# Patient Record
Sex: Male | Born: 1955
Health system: Southern US, Community
[De-identification: ages and names within clinical notes are randomized; demographics above are authoritative.]

## PROBLEM LIST (undated history)

## (undated) DIAGNOSIS — K219 Gastro-esophageal reflux disease without esophagitis: Secondary | ICD-10-CM

## (undated) DIAGNOSIS — N189 Chronic kidney disease, unspecified: Secondary | ICD-10-CM

## (undated) DIAGNOSIS — M1990 Unspecified osteoarthritis, unspecified site: Secondary | ICD-10-CM

## (undated) DIAGNOSIS — I10 Essential (primary) hypertension: Secondary | ICD-10-CM

## (undated) DIAGNOSIS — F32A Depression, unspecified: Secondary | ICD-10-CM

## (undated) HISTORY — PX: OTHER SURGICAL HISTORY: SHX169

---

## 2020-04-08 ENCOUNTER — Ambulatory Visit (INDEPENDENT_AMBULATORY_CARE_PROVIDER_SITE_OTHER): Payer: No Typology Code available for payment source | Admitting: Orthopaedic Surgery

## 2020-04-08 ENCOUNTER — Ambulatory Visit (INDEPENDENT_AMBULATORY_CARE_PROVIDER_SITE_OTHER): Payer: No Typology Code available for payment source

## 2020-04-08 VITALS — Ht 71.0 in | Wt 220.0 lb

## 2020-04-08 DIAGNOSIS — M1611 Unilateral primary osteoarthritis, right hip: Secondary | ICD-10-CM

## 2020-04-08 DIAGNOSIS — M25551 Pain in right hip: Secondary | ICD-10-CM | POA: Diagnosis not present

## 2020-04-08 DIAGNOSIS — M25559 Pain in unspecified hip: Secondary | ICD-10-CM

## 2020-04-08 NOTE — Progress Notes (Signed)
Office Visit Note   Patient: Peter Wheeler           Date of Birth: [birth year]           MRN: [id number] Visit Date: 04/08/2020              Requested by: No referring provider defined for this encounter. PCP: Patient, No Pcp Per   Assessment & Plan: Visit Diagnoses:  1. Hip pain   2. Unilateral primary osteoarthritis, right hip     Plan: The patient does have significant posttraumatic arthritis of his right hip and given his clinical exam findings, x-ray findings and signs and symptoms as well as the failure of his conservative treatment and worsening hip pain, we are recommending hip replacement surgery.  Also due to the fact that his hip pain is now detriment affecting his actives daily living, his mobility and even his posture contributing to his back pain.  I had a long thorough discussion the office today about the risk and benefits of surgery.  We talked about his interoperative and postoperative course.  I shared with him his x-rays.  I showed him a hip model explained in detail what the surgery involves.  The risk and benefits of surgery were explained in detail as well as what to expect in the recovery process.  All questions and concerns were answered and addressed.  We will work on getting this scheduled.  He understands there may still be some delays due to the Covid 19 pandemic and bed availability for inpatient type procedures.  Follow-Up Instructions: Return for 2 weeks post-op.   Orders:  Orders Placed This Encounter  Procedures  . XR HIP UNILAT W OR W/O PELVIS 1V RIGHT   No orders of the defined types were placed in this encounter.     Procedures: No procedures performed   Clinical Data: No additional findings.   Subjective: Chief Complaint  Patient presents with  . Right Hip - Pain  The patient is a very pleasant 64 year old veteran who comes in with a significant history of right hip pain.  He reports that in 2016 he sustained a mechanical fall  injuring that right hip and he sustained a acetabular fracture that did not require surgery but did require time in rehabilitation to get him back on his feet.  He was going through tough period of his life as well with significant depression and the [place] system is work with him greatly on getting through that and working on his mental health.  He is not a diabetic.  He does report 10 out of 10 right hip pain and hurts in the groin and the backside of his right hip.  He states that he was told at some time he would likely need a hip replacement due to posttraumatic arthritis in that hip.  At this point his right hip pain is definitely affecting his mobility, his quality of life and his activities day living.  Previous x-rays from 2017 showed that he was developing posttraumatic arthritic changes.  He is not a diabetic.  He denies any acute change in medical status.  He does state that he does have low back pain with some degenerative changes but some of this he feels like it is related to his hip and I agree based on seeing him walk in his posture.  HPI  Review of Systems He currently denies any headache, chest pain, shortness of breath, fever, chills, nausea, vomiting  Objective: Vital Signs:  Ht 5\' 11"  (1.803 m)   Wt 220 lb (99.8 kg)   BMI 30.68 kg/m   Physical Exam He is alert and oriented x3 and in no acute distress Ortho Exam Examination of his right hip shows stiffness in the groin and pain with internal and external rotation.  The left hip moves normally.  There is a slight leg length discrepancy with his left side shorter than his right. Specialty Comments:  No specialty comments available.  Imaging: XR HIP UNILAT W OR W/O PELVIS 1V RIGHT  Result Date: 04/08/2020 An AP pelvis and lateral of the right hip show significant arthritic changes.  There is actually slight medialization of the femoral head within the acetabulum compared to the opposite side.  There is significant joint space  narrowing and para-articular osteophytes.  There is evidence of an old healed acetabular fracture.    PMFS History: Patient Active Problem List   Diagnosis Date Noted  . Unilateral primary osteoarthritis, right hip 04/08/2020   No past medical history on file.  No family history on file.   Social History   Occupational History  . Not on file  Tobacco Use  . Smoking status: Not on file  Substance and Sexual Activity  . Alcohol use: Not on file  . Drug use: Not on file  . Sexual activity: Not on file

## 2020-06-29 ENCOUNTER — Other Ambulatory Visit: Payer: Self-pay

## 2020-07-06 NOTE — Progress Notes (Signed)
Need orders in epic.  

## 2020-07-07 ENCOUNTER — Other Ambulatory Visit: Payer: Self-pay | Admitting: Physician Assistant

## 2020-07-08 ENCOUNTER — Encounter (HOSPITAL_COMMUNITY): Payer: Self-pay

## 2020-07-08 ENCOUNTER — Other Ambulatory Visit: Payer: Self-pay

## 2020-07-08 ENCOUNTER — Encounter (HOSPITAL_COMMUNITY)
Admission: RE | Admit: 2020-07-08 | Discharge: 2020-07-08 | Disposition: A | Discharge status: Home / Self Care | Payer: No Typology Code available for payment source | Source: Ambulatory Visit | Attending: Orthopaedic Surgery | Admitting: Orthopaedic Surgery

## 2020-07-08 DIAGNOSIS — Z01818 Encounter for other preprocedural examination: Secondary | ICD-10-CM | POA: Insufficient documentation

## 2020-07-08 HISTORY — DX: Gastro-esophageal reflux disease without esophagitis: K21.9

## 2020-07-08 HISTORY — DX: Depression, unspecified: F32.A

## 2020-07-08 HISTORY — DX: Chronic kidney disease, unspecified: N18.9

## 2020-07-08 HISTORY — DX: Unspecified osteoarthritis, unspecified site: M19.90

## 2020-07-08 HISTORY — DX: Essential (primary) hypertension: I10

## 2020-07-08 LAB — CBC
HCT: 49.6 % (ref 39.0–52.0)
Hemoglobin: 16.4 g/dL (ref 13.0–17.0)
MCH: 32.1 pg (ref 26.0–34.0)
MCHC: 33.1 g/dL (ref 30.0–36.0)
MCV: 97.1 fL (ref 80.0–100.0)
Platelets: 216 10*3/uL (ref 150–400)
RBC: 5.11 MIL/uL (ref 4.22–5.81)
RDW: 13.2 % (ref 11.5–15.5)
WBC: 8.2 10*3/uL (ref 4.0–10.5)
nRBC: 0 % (ref 0.0–0.2)

## 2020-07-08 LAB — BASIC METABOLIC PANEL
Anion gap: 11 (ref 5–15)
BUN: 30 mg/dL — ABNORMAL HIGH (ref 8–23)
CO2: 23 mmol/L (ref 22–32)
Calcium: 9.8 mg/dL (ref 8.9–10.3)
Chloride: 101 mmol/L (ref 98–111)
Creatinine, Ser: 1.74 mg/dL — ABNORMAL HIGH (ref 0.61–1.24)
GFR, Estimated: 44 mL/min — ABNORMAL LOW (ref >60)
Glucose, Bld: 93 mg/dL (ref 70–99)
Potassium: 5 mmol/L (ref 3.5–5.1)
Sodium: 135 mmol/L (ref 135–145)

## 2020-07-08 LAB — SURGICAL PCR SCREEN
MRSA, PCR: NEGATIVE
Staphylococcus aureus: NEGATIVE

## 2020-07-08 NOTE — Progress Notes (Signed)
DUE TO COVID-19 ONLY ONE VISITOR IS ALLOWED TO COME WITH YOU AND STAY IN THE WAITING ROOM ONLY DURING PRE OP AND PROCEDURE DAY OF SURGERY. THE 1 VISITOR  MAY VISIT WITH YOU AFTER SURGERY IN YOUR PRIVATE ROOM DURING VISITING HOURS ONLY!  YOU NEED TO HAVE A COVID 19 TEST ON__11/30/2021 _____ @_______ , THIS TEST MUST BE DONE BEFORE SURGERY,  COVID TESTING SITE [address] [place] , IT IS ON THE RIGHT GOING OUT [address] APPROXIMATELY  2 MINUTES PAST [employer] ON THE RIGHT. ONCE YOUR COVID TEST IS COMPLETED,  PLEASE BEGIN THE QUARANTINE INSTRUCTIONS AS OUTLINED IN YOUR HANDOUT.                Peter Wheeler  07/08/2020   Your procedure is scheduled on:  07/17/2020   Report to [facility] Main  Entrance   Report to admitting at     0830AM     Call this number if you have problems the morning of surgery [phone]    REMEMBER: NO  SOLID FOOD CANDY OR GUM AFTER MIDNIGHT. CLEAR LIQUIDS UNTIL 0800am        . NOTHING BY MOUTH EXCEPT CLEAR LIQUIDS UNTIL    . PLEASE FINISH ENSURE DRINK PER SURGEON ORDER  WHICH NEEDS TO BE COMPLETED AT   0800am   .      CLEAR LIQUID DIET   Foods Allowed                                                                    Coffee and tea, regular and decaf                            Fruit ices (not with fruit pulp)                                      Iced Popsicles                                    Carbonated beverages, regular and diet                                    Cranberry, grape and apple juices Sports drinks like Gatorade Lightly seasoned clear broth or consume(fat free) Sugar, honey syrup ___________________________________________________________________      BRUSH YOUR TEETH MORNING OF SURGERY AND RINSE YOUR MOUTH OUT, NO CHEWING GUM CANDY OR MINTS.     Take these medicines the morning of surgery with A SIP OF WATER:  Anmlodipine, atenolol, pepcid, gabapentin, effexor  DO NOT TAKE ANY  DIABETIC MEDICATIONS DAY OF YOUR SURGERY                               You may not have any metal on your body including hair pins and              piercings  Do not wear jewelry, make-up, lotions, powders or perfumes, deodorant             Do not wear nail polish on your fingernails.  Do not shave  48 hours prior to surgery.              Men may shave face and neck.   Do not bring valuables to the hospital. [facility] IS NOT             RESPONSIBLE   FOR VALUABLES.  Contacts, dentures or bridgework may not be worn into surgery.  Leave suitcase in the car. After surgery it may be brought to your room.     Patients discharged the day of surgery will not be allowed to drive home. IF YOU ARE HAVING SURGERY AND GOING HOME THE SAME DAY, YOU MUST HAVE AN ADULT TO DRIVE YOU HOME AND BE WITH YOU FOR 24 HOURS. YOU MAY GO HOME BY TAXI OR UBER OR ORTHERWISE, BUT AN ADULT MUST ACCOMPANY YOU HOME AND STAY WITH YOU FOR 24 HOURS.  Name and phone number of your driver:  Special Instructions: N/A              Please read over the following fact sheets you were given: _____________________________________________________________________  [facility] - Preparing for Surgery Before surgery, you can play an important role.  Because skin is not sterile, your skin needs to be as free of germs as possible.  You can reduce the number of germs on your skin by washing with CHG (chlorahexidine gluconate) soap before surgery.  CHG is an antiseptic cleaner which kills germs and bonds with the skin to continue killing germs even after washing. Please DO NOT use if you have an allergy to CHG or antibacterial soaps.  If your skin becomes reddened/irritated stop using the CHG and inform your nurse when you arrive at Short Stay. Do not shave (including legs and underarms) for at least 48 hours prior to the first CHG shower.  You may shave your face/neck. Please follow these instructions carefully:  1.  Shower with CHG Soap  the night before surgery and the  morning of Surgery.  2.  If you choose to wash your hair, wash your hair first as usual with your  normal  shampoo.  3.  After you shampoo, rinse your hair and body thoroughly to remove the  shampoo.                           4.  Use CHG as you would any other liquid soap.  You can apply chg directly  to the skin and wash                       Gently with a scrungie or clean washcloth.  5.  Apply the CHG Soap to your body ONLY FROM THE NECK DOWN.   Do not use on face/ open                           Wound or open sores. Avoid contact with eyes, ears mouth and genitals (private parts).                       Wash face,  Genitals (private parts) with your normal soap.             6.  Wash  thoroughly, paying special attention to the area where your surgery  will be performed.  7.  Thoroughly rinse your body with warm water from the neck down.  8.  DO NOT shower/wash with your normal soap after using and rinsing off  the CHG Soap.                9.  Pat yourself dry with a clean towel.            10.  Wear clean pajamas.            11.  Place clean sheets on your bed the night of your first shower and do not  sleep with pets. Day of Surgery : Do not apply any lotions/deodorants the morning of surgery.  Please wear clean clothes to the hospital/surgery center.  FAILURE TO FOLLOW THESE INSTRUCTIONS MAY RESULT IN THE CANCELLATION OF YOUR SURGERY PATIENT SIGNATURE_________________________________  NURSE SIGNATURE__________________________________  ________________________________________________________________________

## 2020-07-08 NOTE — Progress Notes (Signed)
Anesthesia Review:  PCP: DR Leta Jungling- [facility] in [place]  Cardiologist : Chest x-ray : EKG : 07/08/20  Echo : Stress test: Cardiac Cath :  Activity level:  Sleep Study/ CPAP :no  Fasting Blood Sugar :      / Checks Blood Sugar -- times a day:   Blood Thinner/ Instructions /Last Dose: ASA / Instructions/ Last Dose :  Blood pressure initally at preop was 167/106.  Patient denies any chest pain, dizziness, headache or blurred vision .  Blood pressure in right arm .  Recheck of blood pressure was 159/105 in right arm after 15 minutes.  Blood pressure in left arm was 157/106.  Peter Wheeler made aware Peter Wheeler, PAC saw pt at preop and spoke with him . EKG at preop shows NSR.  Peter Wheeler aware..  Of above.  No new orders given.

## 2020-07-14 ENCOUNTER — Other Ambulatory Visit (HOSPITAL_COMMUNITY)
Admission: RE | Admit: 2020-07-14 | Discharge: 2020-07-14 | Disposition: A | Discharge status: Home / Self Care | Payer: No Typology Code available for payment source | Source: Ambulatory Visit | Attending: Orthopaedic Surgery | Admitting: Orthopaedic Surgery

## 2020-07-14 DIAGNOSIS — Z20822 Contact with and (suspected) exposure to covid-19: Secondary | ICD-10-CM | POA: Insufficient documentation

## 2020-07-14 DIAGNOSIS — Z01812 Encounter for preprocedural laboratory examination: Secondary | ICD-10-CM | POA: Diagnosis present

## 2020-07-14 LAB — SARS CORONAVIRUS 2 (TAT 6-24 HRS): SARS Coronavirus 2: NEGATIVE

## 2020-07-16 NOTE — H&P (Signed)
TOTAL HIP ADMISSION H&P  Patient is admitted for right total hip arthroplasty.  Subjective:  Chief Complaint: right hip pain  HPI: Peter Wheeler, 64 y.o. male, has a history of pain and functional disability in the right hip(s) due to trauma and arthritis and patient has failed non-surgical conservative treatments for greater than 12 weeks to include NSAID's and/or analgesics, flexibility and strengthening excercises, supervised PT with diminished ADL's post treatment, use of assistive devices and activity modification.  Onset of symptoms was gradual starting 5 years ago with gradually worsening course since that time.The patient noted no past surgery on the right hip(s).  Patient currently rates pain in the right hip at 10 out of 10 with activity. Patient has night pain, worsening of pain with activity and weight bearing, trendelenberg gait, pain that interfers with activities of daily living and pain with passive range of motion. Patient has evidence of subchondral cysts, subchondral sclerosis, periarticular osteophytes and joint space narrowing by imaging studies. This condition presents safety issues increasing the risk of falls.  There is no current active infection.  Patient Active Problem List   Diagnosis Date Noted  . Unilateral primary osteoarthritis, right hip 04/08/2020   Past Medical History:  Diagnosis Date  . Arthritis   . Chronic kidney disease    FOLLOWED BY NEPHROLOGIST - dr Jackelyn Poling  . Depression   . GERD (gastroesophageal reflux disease)   . Hypertension     Past Surgical History:  Procedure Laterality Date  . EAR DRUM SURGERY       No current facility-administered medications for this encounter.   Current Outpatient Medications  Medication Sig Dispense Refill Last Dose  . amLODipine (NORVASC) 5 MG tablet Take 5 mg by mouth daily.     Marland Kitchen atenolol (TENORMIN) 50 MG tablet Take 50 mg by mouth daily.     Marland Kitchen atorvastatin (LIPITOR) 20 MG tablet Take 20 mg by mouth  at bedtime.     . calcium carbonate (OSCAL) 1500 (600 Ca) MG TABS tablet Take 600 mg of elemental calcium by mouth 2 (two) times daily with a meal.     . calcium carbonate (TUMS - DOSED IN MG ELEMENTAL CALCIUM) 500 MG chewable tablet Chew 1 tablet by mouth daily as needed for indigestion or heartburn.     . famotidine (PEPCID) 40 MG tablet Take 40 mg by mouth 2 (two) times daily.     Marland Kitchen gabapentin (NEURONTIN) 600 MG tablet Take 600 mg by mouth 2 (two) times daily.     Marland Kitchen GLUCOSAMINE-CHONDROITIN PO Take 1 tablet by mouth in the morning and at bedtime. 1.500 1.200     . lisinopril (ZESTRIL) 40 MG tablet Take 40 mg by mouth daily.     . Magnesium Oxide 420 (252 Mg) MG TABS Take 420 mg by mouth at bedtime.     . mirtazapine (REMERON) 45 MG tablet Take 45 mg by mouth at bedtime.     . Nutritional Supplements (COLD AND FLU PO) Take 1 application by mouth daily as needed (Cold and flu).     . tamsulosin (FLOMAX) 0.4 MG CAPS capsule Take 0.4 mg by mouth at bedtime.     Marland Kitchen venlafaxine (EFFEXOR) 75 MG tablet Take 225 mg by mouth daily.     . vitamin B-12 (CYANOCOBALAMIN) 500 MCG tablet Take 1,000 mcg by mouth daily.      No Known Allergies  Social History   Tobacco Use  . Smoking status: Never Smoker  . Smokeless tobacco: Never  Used  Substance Use Topics  . Alcohol use: Yes    Comment: 4-5 DRINKS PER WEEK     No family history on file.   Review of Systems  Musculoskeletal: Positive for back pain, gait problem and joint swelling.  All other systems reviewed and are negative.   Objective:  Physical Exam Vitals reviewed.  Constitutional:      Appearance: Normal appearance.  HENT:     Head: Normocephalic and atraumatic.  Eyes:     Extraocular Movements: Extraocular movements intact.     Pupils: Pupils are equal, round, and reactive to light.  Cardiovascular:     Rate and Rhythm: Normal rate.  Pulmonary:     Effort: Pulmonary effort is normal.  Musculoskeletal:     Cervical back: Normal  range of motion.     Right hip: Tenderness and bony tenderness present. Decreased range of motion. Decreased strength.  Neurological:     Mental Status: He is alert and oriented to person, place, and time.  Psychiatric:        Behavior: Behavior normal.     Vital signs in last 24 hours:    Labs:   Estimated body mass index is 30.68 kg/m as calculated from the following:   Height as of 07/08/20: 5\' 11"  (1.803 m).   Weight as of 04/08/20: 99.8 kg.   Imaging Review Plain radiographs demonstrate severe degenerative joint disease of the right hip(s). The bone quality appears to be good for age and reported activity level.      Assessment/Plan:  End stage arthritis, right hip(s)  The patient history, physical examination, clinical judgement of the provider and imaging studies are consistent with end stage degenerative joint disease of the right hip(s) and total hip arthroplasty is deemed medically necessary. The treatment options including medical management, injection therapy, arthroscopy and arthroplasty were discussed at length. The risks and benefits of total hip arthroplasty were presented and reviewed. The risks due to aseptic loosening, infection, stiffness, dislocation/subluxation,  thromboembolic complications and other imponderables were discussed.  The patient acknowledged the explanation, agreed to proceed with the plan and consent was signed. Patient is being admitted for inpatient treatment for surgery, pain control, PT, OT, prophylactic antibiotics, VTE prophylaxis, progressive ambulation and ADL's and discharge planning.The patient is planning to be discharged home with home health services

## 2020-07-17 ENCOUNTER — Observation Stay (HOSPITAL_COMMUNITY)
Admission: RE | Admit: 2020-07-17 | Discharge: 2020-07-18 | Disposition: A | Discharge status: Home / Self Care | Payer: No Typology Code available for payment source | Attending: Orthopaedic Surgery | Admitting: Orthopaedic Surgery

## 2020-07-17 ENCOUNTER — Observation Stay (HOSPITAL_COMMUNITY): Payer: No Typology Code available for payment source

## 2020-07-17 ENCOUNTER — Other Ambulatory Visit: Payer: Self-pay

## 2020-07-17 ENCOUNTER — Ambulatory Visit (HOSPITAL_COMMUNITY): Payer: No Typology Code available for payment source | Admitting: Physician Assistant

## 2020-07-17 ENCOUNTER — Ambulatory Visit (HOSPITAL_COMMUNITY): Payer: No Typology Code available for payment source

## 2020-07-17 ENCOUNTER — Encounter (HOSPITAL_COMMUNITY): Payer: Self-pay | Admitting: Orthopaedic Surgery

## 2020-07-17 ENCOUNTER — Encounter (HOSPITAL_COMMUNITY)
Admission: RE | Disposition: A | Discharge status: Home / Self Care | Payer: Self-pay | Source: Home / Self Care | Attending: Orthopaedic Surgery

## 2020-07-17 DIAGNOSIS — N189 Chronic kidney disease, unspecified: Secondary | ICD-10-CM | POA: Insufficient documentation

## 2020-07-17 DIAGNOSIS — Z96641 Presence of right artificial hip joint: Secondary | ICD-10-CM

## 2020-07-17 DIAGNOSIS — M25551 Pain in right hip: Secondary | ICD-10-CM | POA: Diagnosis present

## 2020-07-17 DIAGNOSIS — M1611 Unilateral primary osteoarthritis, right hip: Secondary | ICD-10-CM

## 2020-07-17 DIAGNOSIS — Z79899 Other long term (current) drug therapy: Secondary | ICD-10-CM | POA: Insufficient documentation

## 2020-07-17 DIAGNOSIS — I129 Hypertensive chronic kidney disease with stage 1 through stage 4 chronic kidney disease, or unspecified chronic kidney disease: Secondary | ICD-10-CM | POA: Insufficient documentation

## 2020-07-17 DIAGNOSIS — Z419 Encounter for procedure for purposes other than remedying health state, unspecified: Secondary | ICD-10-CM

## 2020-07-17 HISTORY — PX: TOTAL HIP ARTHROPLASTY: SHX124

## 2020-07-17 LAB — TYPE AND SCREEN
ABO/RH(D): O POS
Antibody Screen: NEGATIVE

## 2020-07-17 LAB — ABO/RH: ABO/RH(D): O POS

## 2020-07-17 SURGERY — ARTHROPLASTY, HIP, TOTAL, ANTERIOR APPROACH
Anesthesia: Spinal | Site: Hip | Laterality: Right

## 2020-07-17 MED ORDER — FAMOTIDINE 20 MG PO TABS
40.0000 mg | ORAL_TABLET | Freq: Two times a day (BID) | ORAL | Status: DC
  Administered 2020-07-17 – 2020-07-18 (×2): 40 mg via ORAL
  Filled 2020-07-17 (×2): qty 2

## 2020-07-17 MED ORDER — ONDANSETRON HCL 4 MG/2ML IJ SOLN
INTRAMUSCULAR | Status: DC | PRN
  Administered 2020-07-17: 4 mg via INTRAVENOUS

## 2020-07-17 MED ORDER — PHENYLEPHRINE HCL-NACL 10-0.9 MG/250ML-% IV SOLN
INTRAVENOUS | Status: DC | PRN
  Administered 2020-07-17: 40 ug/min via INTRAVENOUS

## 2020-07-17 MED ORDER — ASPIRIN 81 MG PO CHEW
81.0000 mg | CHEWABLE_TABLET | Freq: Two times a day (BID) | ORAL | Status: DC
  Administered 2020-07-17 – 2020-07-18 (×2): 81 mg via ORAL
  Filled 2020-07-17 (×2): qty 1

## 2020-07-17 MED ORDER — METHOCARBAMOL 500 MG IVPB - SIMPLE MED
500.0000 mg | Freq: Four times a day (QID) | INTRAVENOUS | Status: DC | PRN
  Filled 2020-07-17: qty 50

## 2020-07-17 MED ORDER — EPHEDRINE SULFATE 50 MG/ML IJ SOLN
INTRAMUSCULAR | Status: DC | PRN
  Administered 2020-07-17: 10 mg via INTRAVENOUS
  Administered 2020-07-17: 15 mg via INTRAVENOUS
  Administered 2020-07-17: 10 mg via INTRAVENOUS
  Administered 2020-07-17: 15 mg via INTRAVENOUS
  Administered 2020-07-17: 10 mg via INTRAVENOUS

## 2020-07-17 MED ORDER — MAGNESIUM OXIDE 400 (241.3 MG) MG PO TABS
420.0000 mg | ORAL_TABLET | Freq: Every day | ORAL | Status: DC
  Administered 2020-07-17: 400 mg via ORAL
  Filled 2020-07-17: qty 1

## 2020-07-17 MED ORDER — EPHEDRINE 5 MG/ML INJ
INTRAVENOUS | Status: AC
  Filled 2020-07-17: qty 20

## 2020-07-17 MED ORDER — ACETAMINOPHEN 325 MG PO TABS
325.0000 mg | ORAL_TABLET | Freq: Four times a day (QID) | ORAL | Status: DC | PRN
  Administered 2020-07-17: 650 mg via ORAL
  Filled 2020-07-17: qty 2

## 2020-07-17 MED ORDER — STERILE WATER FOR IRRIGATION IR SOLN
Status: DC | PRN
  Administered 2020-07-17: 2000 mL

## 2020-07-17 MED ORDER — LACTATED RINGERS IV SOLN: INTRAVENOUS | Status: DC

## 2020-07-17 MED ORDER — PANTOPRAZOLE SODIUM 40 MG PO TBEC
40.0000 mg | DELAYED_RELEASE_TABLET | Freq: Every day | ORAL | Status: DC
  Administered 2020-07-18: 40 mg via ORAL
  Filled 2020-07-17: qty 1

## 2020-07-17 MED ORDER — VENLAFAXINE HCL 75 MG PO TABS
225.0000 mg | ORAL_TABLET | Freq: Every day | ORAL | Status: DC
  Administered 2020-07-18: 225 mg via ORAL
  Filled 2020-07-17: qty 3

## 2020-07-17 MED ORDER — 0.9 % SODIUM CHLORIDE (POUR BTL) OPTIME
TOPICAL | Status: DC | PRN
  Administered 2020-07-17: 1000 mL

## 2020-07-17 MED ORDER — POLYETHYLENE GLYCOL 3350 17 G PO PACK: 17.0000 g | PACK | Freq: Every day | ORAL | Status: DC | PRN

## 2020-07-17 MED ORDER — PROPOFOL 500 MG/50ML IV EMUL
INTRAVENOUS | Status: DC | PRN
  Administered 2020-07-17: 20 mg via INTRAVENOUS

## 2020-07-17 MED ORDER — HYDROMORPHONE HCL 1 MG/ML IJ SOLN
0.5000 mg | INTRAMUSCULAR | Status: DC | PRN
  Administered 2020-07-17: 1 mg via INTRAVENOUS
  Filled 2020-07-17: qty 1

## 2020-07-17 MED ORDER — AMLODIPINE BESYLATE 5 MG PO TABS
5.0000 mg | ORAL_TABLET | Freq: Every day | ORAL | Status: DC
  Administered 2020-07-18: 5 mg via ORAL
  Filled 2020-07-17: qty 1

## 2020-07-17 MED ORDER — PROPOFOL 500 MG/50ML IV EMUL
INTRAVENOUS | Status: AC
  Filled 2020-07-17: qty 150

## 2020-07-17 MED ORDER — PHENOL 1.4 % MT LIQD: 1.0000 | OROMUCOSAL | Status: DC | PRN

## 2020-07-17 MED ORDER — DEXAMETHASONE SODIUM PHOSPHATE 10 MG/ML IJ SOLN
INTRAMUSCULAR | Status: DC | PRN
  Administered 2020-07-17: 10 mg via INTRAVENOUS

## 2020-07-17 MED ORDER — CEFAZOLIN SODIUM-DEXTROSE 1-4 GM/50ML-% IV SOLN
1.0000 g | Freq: Four times a day (QID) | INTRAVENOUS | Status: AC
  Administered 2020-07-17 (×2): 1 g via INTRAVENOUS
  Filled 2020-07-17 (×2): qty 50

## 2020-07-17 MED ORDER — LACTATED RINGERS IV SOLN: INTRAVENOUS | Status: DC | PRN

## 2020-07-17 MED ORDER — FENTANYL CITRATE (PF) 100 MCG/2ML IJ SOLN
INTRAMUSCULAR | Status: AC
  Filled 2020-07-17: qty 2

## 2020-07-17 MED ORDER — CHLORHEXIDINE GLUCONATE 0.12 % MT SOLN
15.0000 mL | Freq: Once | OROMUCOSAL | Status: AC
  Administered 2020-07-17: 15 mL via OROMUCOSAL

## 2020-07-17 MED ORDER — PHENYLEPHRINE HCL (PRESSORS) 10 MG/ML IV SOLN
INTRAVENOUS | Status: DC | PRN
  Administered 2020-07-17 (×3): 80 ug via INTRAVENOUS

## 2020-07-17 MED ORDER — VITAMIN B-12 1000 MCG PO TABS
1000.0000 ug | ORAL_TABLET | Freq: Every day | ORAL | Status: DC
  Administered 2020-07-18: 1000 ug via ORAL
  Filled 2020-07-17: qty 1

## 2020-07-17 MED ORDER — MEPERIDINE HCL 50 MG/ML IJ SOLN: 6.2500 mg | INTRAMUSCULAR | Status: DC | PRN

## 2020-07-17 MED ORDER — PROPOFOL 10 MG/ML IV BOLUS
INTRAVENOUS | Status: AC
  Filled 2020-07-17: qty 20

## 2020-07-17 MED ORDER — ATORVASTATIN CALCIUM 20 MG PO TABS
20.0000 mg | ORAL_TABLET | Freq: Every day | ORAL | Status: DC
  Administered 2020-07-17: 20 mg via ORAL
  Filled 2020-07-17: qty 1

## 2020-07-17 MED ORDER — CEFAZOLIN SODIUM-DEXTROSE 2-4 GM/100ML-% IV SOLN
2.0000 g | INTRAVENOUS | Status: AC
  Administered 2020-07-17: 2 g via INTRAVENOUS
  Filled 2020-07-17: qty 100

## 2020-07-17 MED ORDER — PHENYLEPHRINE 40 MCG/ML (10ML) SYRINGE FOR IV PUSH (FOR BLOOD PRESSURE SUPPORT)
PREFILLED_SYRINGE | INTRAVENOUS | Status: AC
  Filled 2020-07-17: qty 10

## 2020-07-17 MED ORDER — FENTANYL CITRATE (PF) 100 MCG/2ML IJ SOLN
INTRAMUSCULAR | Status: DC | PRN
  Administered 2020-07-17: 100 ug via INTRAVENOUS

## 2020-07-17 MED ORDER — ONDANSETRON HCL 4 MG/2ML IJ SOLN: 4.0000 mg | Freq: Four times a day (QID) | INTRAMUSCULAR | Status: DC | PRN

## 2020-07-17 MED ORDER — METOCLOPRAMIDE HCL 5 MG/ML IJ SOLN: 5.0000 mg | Freq: Three times a day (TID) | INTRAMUSCULAR | Status: DC | PRN

## 2020-07-17 MED ORDER — HYDROMORPHONE HCL 1 MG/ML IJ SOLN: 0.2500 mg | INTRAMUSCULAR | Status: DC | PRN

## 2020-07-17 MED ORDER — DOCUSATE SODIUM 100 MG PO CAPS
100.0000 mg | ORAL_CAPSULE | Freq: Two times a day (BID) | ORAL | Status: DC
  Administered 2020-07-17 – 2020-07-18 (×2): 100 mg via ORAL
  Filled 2020-07-17 (×2): qty 1

## 2020-07-17 MED ORDER — GABAPENTIN 300 MG PO CAPS
600.0000 mg | ORAL_CAPSULE | Freq: Two times a day (BID) | ORAL | Status: DC
  Administered 2020-07-17 – 2020-07-18 (×2): 600 mg via ORAL
  Filled 2020-07-17 (×2): qty 2

## 2020-07-17 MED ORDER — TAMSULOSIN HCL 0.4 MG PO CAPS
0.4000 mg | ORAL_CAPSULE | Freq: Every day | ORAL | Status: DC
  Administered 2020-07-17: 0.4 mg via ORAL
  Filled 2020-07-17: qty 1

## 2020-07-17 MED ORDER — METHOCARBAMOL 500 MG PO TABS
500.0000 mg | ORAL_TABLET | Freq: Four times a day (QID) | ORAL | Status: DC | PRN
  Administered 2020-07-17 – 2020-07-18 (×4): 500 mg via ORAL
  Filled 2020-07-17 (×4): qty 1

## 2020-07-17 MED ORDER — SODIUM CHLORIDE 0.9 % IV SOLN: INTRAVENOUS | Status: DC

## 2020-07-17 MED ORDER — DIPHENHYDRAMINE HCL 12.5 MG/5ML PO ELIX: 12.5000 mg | ORAL_SOLUTION | ORAL | Status: DC | PRN

## 2020-07-17 MED ORDER — CALCIUM CARBONATE 1250 (500 CA) MG PO TABS
500.0000 mg | ORAL_TABLET | Freq: Two times a day (BID) | ORAL | Status: DC
  Administered 2020-07-18: 500 mg via ORAL
  Filled 2020-07-17: qty 1

## 2020-07-17 MED ORDER — ONDANSETRON HCL 4 MG PO TABS: 4.0000 mg | ORAL_TABLET | Freq: Four times a day (QID) | ORAL | Status: DC | PRN

## 2020-07-17 MED ORDER — ALUM & MAG HYDROXIDE-SIMETH 200-200-20 MG/5ML PO SUSP: 30.0000 mL | ORAL | Status: DC | PRN

## 2020-07-17 MED ORDER — OXYCODONE HCL 5 MG PO TABS: 5.0000 mg | ORAL_TABLET | ORAL | Status: DC | PRN

## 2020-07-17 MED ORDER — MENTHOL 3 MG MT LOZG: 1.0000 | LOZENGE | OROMUCOSAL | Status: DC | PRN

## 2020-07-17 MED ORDER — OXYCODONE HCL 5 MG PO TABS
10.0000 mg | ORAL_TABLET | ORAL | Status: DC | PRN
  Administered 2020-07-17: 10 mg via ORAL
  Administered 2020-07-17 – 2020-07-18 (×3): 15 mg via ORAL
  Filled 2020-07-17 (×3): qty 3
  Filled 2020-07-17: qty 2

## 2020-07-17 MED ORDER — PROPOFOL 500 MG/50ML IV EMUL
INTRAVENOUS | Status: DC | PRN
  Administered 2020-07-17: 50 ug/kg/min via INTRAVENOUS

## 2020-07-17 MED ORDER — TRANEXAMIC ACID-NACL 1000-0.7 MG/100ML-% IV SOLN
1000.0000 mg | INTRAVENOUS | Status: AC
  Administered 2020-07-17: 1000 mg via INTRAVENOUS
  Filled 2020-07-17: qty 100

## 2020-07-17 MED ORDER — ATENOLOL 50 MG PO TABS
50.0000 mg | ORAL_TABLET | Freq: Every day | ORAL | Status: DC
  Filled 2020-07-17: qty 1

## 2020-07-17 MED ORDER — SODIUM CHLORIDE 0.9 % IR SOLN
Status: DC | PRN
  Administered 2020-07-17: 1000 mL

## 2020-07-17 MED ORDER — ONDANSETRON HCL 4 MG/2ML IJ SOLN: 4.0000 mg | Freq: Once | INTRAMUSCULAR | Status: DC | PRN

## 2020-07-17 MED ORDER — MIRTAZAPINE 15 MG PO TABS
45.0000 mg | ORAL_TABLET | Freq: Every day | ORAL | Status: DC
  Administered 2020-07-17: 45 mg via ORAL
  Filled 2020-07-17: qty 3

## 2020-07-17 MED ORDER — MIDAZOLAM HCL 5 MG/5ML IJ SOLN
INTRAMUSCULAR | Status: DC | PRN
  Administered 2020-07-17: 2 mg via INTRAVENOUS

## 2020-07-17 MED ORDER — ORAL CARE MOUTH RINSE: 15.0000 mL | Freq: Once | OROMUCOSAL | Status: AC

## 2020-07-17 MED ORDER — ZOLPIDEM TARTRATE 5 MG PO TABS
5.0000 mg | ORAL_TABLET | Freq: Every evening | ORAL | Status: DC | PRN
  Filled 2020-07-17: qty 1

## 2020-07-17 MED ORDER — POVIDONE-IODINE 10 % EX SWAB
2.0000 "application " | Freq: Once | CUTANEOUS | Status: AC
  Administered 2020-07-17: 2 via TOPICAL

## 2020-07-17 MED ORDER — MIDAZOLAM HCL 2 MG/2ML IJ SOLN
INTRAMUSCULAR | Status: AC
  Filled 2020-07-17: qty 2

## 2020-07-17 MED ORDER — METOCLOPRAMIDE HCL 5 MG PO TABS: 5.0000 mg | ORAL_TABLET | Freq: Three times a day (TID) | ORAL | Status: DC | PRN

## 2020-07-17 SURGICAL SUPPLY — 45 items
BAG ZIPLOCK 12X15 (MISCELLANEOUS) IMPLANT
BENZOIN TINCTURE PRP APPL 2/3 (GAUZE/BANDAGES/DRESSINGS) IMPLANT
BLADE SAW SGTL 18X1.27X75 (BLADE) ×2 IMPLANT
BLADE SAW SGTL 18X1.27X75MM (BLADE) ×1
CLOSURE WOUND 1/2 X4 (GAUZE/BANDAGES/DRESSINGS)
COLLAR OFFSET CORAIL SZ 12 HIP (Stem) ×1 IMPLANT
CORAIL OFFSET COLLAR SZ 12 HIP (Stem) ×3 IMPLANT
COVER PERINEAL POST (MISCELLANEOUS) ×3 IMPLANT
COVER SURGICAL LIGHT HANDLE (MISCELLANEOUS) ×3 IMPLANT
COVER WAND RF STERILE (DRAPES) ×3 IMPLANT
CUP ACET PNNCL SECTR W/GRIP 56 (Hips) ×1 IMPLANT
DRAPE STERI IOBAN 125X83 (DRAPES) ×3 IMPLANT
DRAPE U-SHAPE 47X51 STRL (DRAPES) ×6 IMPLANT
DRESSING AQUACEL AG SP 3.5X10 (GAUZE/BANDAGES/DRESSINGS) ×1 IMPLANT
DRSG AQUACEL AG ADV 3.5X10 (GAUZE/BANDAGES/DRESSINGS) ×3 IMPLANT
DRSG AQUACEL AG SP 3.5X10 (GAUZE/BANDAGES/DRESSINGS) ×3
DURAPREP 26ML APPLICATOR (WOUND CARE) ×3 IMPLANT
ELECT REM PT RETURN 15FT ADLT (MISCELLANEOUS) ×3 IMPLANT
GAUZE XEROFORM 1X8 LF (GAUZE/BANDAGES/DRESSINGS) ×3 IMPLANT
GLOVE BIO SURGEON STRL SZ7.5 (GLOVE) ×3 IMPLANT
GLOVE BIOGEL PI IND STRL 8 (GLOVE) ×2 IMPLANT
GLOVE BIOGEL PI INDICATOR 8 (GLOVE) ×4
GLOVE ECLIPSE 8.0 STRL XLNG CF (GLOVE) ×3 IMPLANT
GOWN STRL REUS W/TWL XL LVL3 (GOWN DISPOSABLE) ×6 IMPLANT
HANDPIECE INTERPULSE COAX TIP (DISPOSABLE) ×2
HEAD M SROM 36MM 2 (Hips) ×1 IMPLANT
HOLDER FOLEY CATH W/STRAP (MISCELLANEOUS) ×3 IMPLANT
KIT TURNOVER KIT A (KITS) IMPLANT
PACK ANTERIOR HIP CUSTOM (KITS) ×3 IMPLANT
PENCIL SMOKE EVACUATOR (MISCELLANEOUS) IMPLANT
PINN SECTOR W/GRIP ACE CUP 56 (Hips) ×3 IMPLANT
PINNACLE ALTRX PLUS 4 N 36X56 (Hips) ×3 IMPLANT
SCREW 6.5MMX25MM (Screw) ×3 IMPLANT
SET HNDPC FAN SPRY TIP SCT (DISPOSABLE) ×1 IMPLANT
SROM M HEAD 36MM 2 (Hips) ×3 IMPLANT
STAPLER VISISTAT 35W (STAPLE) ×3 IMPLANT
STRIP CLOSURE SKIN 1/2X4 (GAUZE/BANDAGES/DRESSINGS) IMPLANT
SUT ETHIBOND NAB CT1 #1 30IN (SUTURE) ×3 IMPLANT
SUT ETHILON 2 0 PS N (SUTURE) IMPLANT
SUT MNCRL AB 4-0 PS2 18 (SUTURE) IMPLANT
SUT VIC AB 0 CT1 36 (SUTURE) ×3 IMPLANT
SUT VIC AB 1 CT1 36 (SUTURE) ×3 IMPLANT
SUT VIC AB 2-0 CT1 27 (SUTURE) ×4
SUT VIC AB 2-0 CT1 TAPERPNT 27 (SUTURE) ×2 IMPLANT
TRAY FOLEY MTR SLVR 16FR STAT (SET/KITS/TRAYS/PACK) ×3 IMPLANT

## 2020-07-17 NOTE — Anesthesia Postprocedure Evaluation (Signed)
Anesthesia Post Note  Patient: Peter Wheeler  Procedure(s) Performed: RIGHT TOTAL HIP ARTHROPLASTY ANTERIOR APPROACH (Right Hip)     Patient location during evaluation: PACU Anesthesia Type: Spinal Level of consciousness: oriented and awake and alert Pain management: pain level controlled Vital Signs Assessment: post-procedure vital signs reviewed and stable Respiratory status: spontaneous breathing, respiratory function stable and patient connected to nasal cannula oxygen Cardiovascular status: blood pressure returned to baseline and stable Postop Assessment: no headache, no backache and no apparent nausea or vomiting Anesthetic complications: no   No complications documented.  Last Vitals:  Vitals:   07/17/20 1400 07/17/20 1415  BP: 102/67 108/90  Pulse: (!) 56 (!) 56  Resp: 16 20  Temp:    SpO2: 98% 100%    Last Pain:  Vitals:   07/17/20 1400  TempSrc:   PainSc: 0-No pain                 Cecil Vandyke DAVID

## 2020-07-17 NOTE — Brief Op Note (Signed)
07/17/2020  1:37 PM  PATIENT:  Peter Wheeler  64 y.o. male  PRE-OPERATIVE DIAGNOSIS:  Osteoarthritis Right Hip  POST-OPERATIVE DIAGNOSIS:  Osteoarthritis Right Hip  PROCEDURE:  Procedure(s): RIGHT TOTAL HIP ARTHROPLASTY ANTERIOR APPROACH (Right)  SURGEON:  Surgeon(s) and Role:    Kathryne Hitch, MD - Primary  PHYSICIAN ASSISTANT:  Rexene Edison, PA-C  ANESTHESIA:   spinal  EBL:  200 mL    DICTATION: .Other Dictation: Dictation Number [id number]  PLAN OF CARE: Admit for overnight observation  PATIENT DISPOSITION:  PACU - hemodynamically stable.   Delay start of Pharmacological VTE agent (>24hrs) due to surgical blood loss or risk of bleeding: no

## 2020-07-17 NOTE — Plan of Care (Signed)
  Problem: Pain Management: Goal: Pain level will decrease with appropriate interventions Outcome: Progressing   

## 2020-07-17 NOTE — Evaluation (Signed)
Physical Therapy Evaluation Patient Details Name: Peter Wheeler MRN: [id number] DOB: [birth year] Today's Date: 07/17/2020   History of Present Illness  Patient is 64 y.o. male s/p Rt THA anterior approach on 07/17/20 with PMH significant for HTN, GERD, CKD, OA, depression.    Clinical Impression  Robie Mcniel is a 64 y.o. male POD 0 s/p Rt THA. Patient reports independence with mobility at baseline. Patient is now limited by functional impairments (see PT problem list below) and requires min assist for transfers and gait with RW. Patient was limited to several small steps with RW and min assist. He c/o lightheadedness and overheated sensation after ~ 5' and seated rest was provided. BP assessed and noted top be 105/64, then assessed and 88/62. BP increased to 96/56 mmHg after being reclined in chair for several minutes. Ice applied to pt's hip and Rn notified of BP and pt's pain level. Patient will benefit from continued skilled PT interventions to address impairments and progress towards PLOF. Acute PT will follow to progress mobility and stair training in preparation for safe discharge home.     Follow Up Recommendations Follow surgeon's recommendation for DC plan and follow-up therapies;Home health PT    Equipment Recommendations  Rolling walker with 5" wheels;3in1 (PT)    Recommendations for Other Services       Precautions / Restrictions Precautions Precautions: Fall Restrictions Weight Bearing Restrictions: No Other Position/Activity Restrictions: WBAT      Mobility  Bed Mobility Overal bed mobility: Needs Assistance Bed Mobility: Supine to Sit     Supine to sit: Min assist;HOB elevated     General bed mobility comments: cues to use bed rail and assist to bring Rt LE off EOB and raise trunk.     Transfers Overall transfer level: Needs assistance Equipment used: Rolling walker (2 wheeled) Transfers: Sit to/from Stand Sit to Stand: Min assist;From elevated  surface         General transfer comment: VC's for hand placement/technique with RW. Assist required for power up, pt limited by pain.  Ambulation/Gait Ambulation/Gait assistance: Min assist Gait Distance (Feet): 5 Feet Assistive device: Rolling walker (2 wheeled) Gait Pattern/deviations: Step-to pattern;Decreased stride length Gait velocity: decr   General Gait Details: pt took several small steps forwards and turned to sit in recliner. Min assist to manage RW throughout. pt reported overheated and lightheaded sensation so seated rest was required.  Stairs            Wheelchair Mobility    Modified Rankin (Stroke Patients Only)       Balance Overall balance assessment: Needs assistance Sitting-balance support: Feet supported Sitting balance-Leahy Scale: Fair     Standing balance support: During functional activity;Bilateral upper extremity supported Standing balance-Leahy Scale: Poor                               Pertinent Vitals/Pain Pain Assessment: 0-10 Pain Score: 9  Pain Location: Rt thigh Pain Descriptors / Indicators: Aching;Discomfort;Sore Pain Intervention(s): Limited activity within patient's tolerance;Monitored during session;Repositioned;Ice applied    Home Living Family/patient expects to be discharged to:: Private residence Living Arrangements: Alone Available Help at Discharge: Home health Type of Home: Apartment Home Access: Stairs to enter   [employer] of Steps: 3 flights of stairs Home Layout: One level        Prior Function Level of Independence: Independent  Hand Dominance   Dominant Hand: Right    Extremity/Trunk Assessment   Upper Extremity Assessment Upper Extremity Assessment: Overall WFL for tasks assessed    Lower Extremity Assessment Lower Extremity Assessment: RLE deficits/detail;Overall WFL for tasks assessed RLE: Unable to fully assess due to pain RLE Sensation:  WNL RLE Coordination: WNL    Cervical / Trunk Assessment Cervical / Trunk Assessment: Normal  Communication   Communication: No difficulties  Cognition Arousal/Alertness: Awake/alert Behavior During Therapy: WFL for tasks assessed/performed Overall Cognitive Status: Within Functional Limits for tasks assessed                                        General Comments      Exercises Total Joint Exercises Knee Flexion: AROM;Right;5 reps;Standing   Assessment/Plan    PT Assessment Patient needs continued PT services  PT Problem List Decreased strength;Decreased range of motion;Decreased activity tolerance;Decreased balance;Decreased mobility;Decreased knowledge of use of DME;Pain;Decreased knowledge of precautions       PT Treatment Interventions DME instruction;Gait training;Stair training;Functional mobility training;Therapeutic activities;Therapeutic exercise;Balance training;Patient/family education    PT Goals (Current goals can be found in the Care Plan section)  Acute Rehab PT Goals Patient Stated Goal: stop hurting PT Goal Formulation: With patient Time For Goal Achievement: 07/24/20 Potential to Achieve Goals: Good    Frequency 7X/week   Barriers to discharge Inaccessible home environment pt has 3 flights of stairs to ascend    Co-evaluation               AM-PAC PT "6 Clicks" Mobility  Outcome Measure Help needed turning from your back to your side while in a flat bed without using bedrails?: A Little Help needed moving from lying on your back to sitting on the side of a flat bed without using bedrails?: A Little Help needed moving to and from a bed to a chair (including a wheelchair)?: A Little Help needed standing up from a chair using your arms (e.g., wheelchair or bedside chair)?: A Little Help needed to walk in hospital room?: A Lot Help needed climbing 3-5 steps with a railing? : A Lot 6 Click Score: 16    End of Session Equipment  Utilized During Treatment: Gait belt Activity Tolerance: Patient tolerated treatment well Patient left: in chair;with call bell/phone within reach;with chair alarm set Nurse Communication: Mobility status;Patient requests pain meds (BP) PT Visit Diagnosis: Muscle weakness (generalized) (M62.81);Difficulty in walking, not elsewhere classified (R26.2);Pain Pain - Right/Left: Right Pain - part of body: Hip    Time: 1734-1800 PT Time Calculation (min) (ACUTE ONLY): 26 min   Charges:   PT Evaluation $PT Eval Low Complexity: 1 Low PT Treatments $Therapeutic Activity: 8-22 mins       Wynn Maudlin, DPT Acute Rehabilitation Services  Office [phone] Pager [phone]  07/17/2020 6:20 PM

## 2020-07-17 NOTE — Interval H&P Note (Signed)
History and Physical Interval Note: Patient understands that he is here today for a right total hip arthroplasty to treat the pain from his right hip osteoarthritis.  There has been no acute change in his medical status.  See recent H&P.  The risk and benefits of surgery have been discussed in detail and informed consent is obtained.  The right hip has been marked.  07/17/2020 10:30 AM  Peter Wheeler  has presented today for surgery, with the diagnosis of Osteoarthritis Right Hip.  The various methods of treatment have been discussed with the patient and family. After consideration of risks, benefits and other options for treatment, the patient has consented to  Procedure(s): RIGHT TOTAL HIP ARTHROPLASTY ANTERIOR APPROACH (Right) as a surgical intervention.  The patient's history has been reviewed, patient examined, no change in status, stable for surgery.  I have reviewed the patient's chart and labs.  Questions were answered to the patient's satisfaction.     Kathryne Hitch

## 2020-07-17 NOTE — Anesthesia Preprocedure Evaluation (Signed)
Anesthesia Evaluation  Patient identified by MRN, date of birth, ID band Patient awake    Reviewed: Allergy & Precautions, NPO status , Patient's Chart, lab work & pertinent test results  Airway Mallampati: I  TM Distance: >3 FB Neck ROM: Full    Dental   Pulmonary    Pulmonary exam normal        Cardiovascular hypertension, Pt. on medications Normal cardiovascular exam     Neuro/Psych Depression    GI/Hepatic GERD  Medicated and Controlled,  Endo/Other    Renal/GU Renal InsufficiencyRenal disease     Musculoskeletal   Abdominal   Peds  Hematology   Anesthesia Other Findings   Reproductive/Obstetrics                             Anesthesia Physical Anesthesia Plan  ASA: III  Anesthesia Plan: Spinal   Post-op Pain Management:    Induction: Intravenous  PONV Risk Score and Plan: 1 and Ondansetron and Treatment may vary due to age or medical condition  Airway Management Planned: Nasal Cannula  Additional Equipment:   Intra-op Plan:   Post-operative Plan:   Informed Consent: I have reviewed the patients History and Physical, chart, labs and discussed the procedure including the risks, benefits and alternatives for the proposed anesthesia with the patient or authorized representative who has indicated his/her understanding and acceptance.       Plan Discussed with: CRNA and Surgeon  Anesthesia Plan Comments:         Anesthesia Quick Evaluation

## 2020-07-17 NOTE — Progress Notes (Signed)
Orthopedic Tech Progress Note Patient Details:  Peter Wheeler 1955-10-05 [id number]  Ortho Devices Ortho Device/Splint Location: Trapeze bar Ortho Device/Splint Interventions: Application   Post Interventions Patient Tolerated: Well Instructions Provided: Care of device   [name] 07/17/2020, 5:27 PM

## 2020-07-17 NOTE — Transfer of Care (Signed)
Immediate Anesthesia Transfer of Care Note  Patient: Peter Wheeler  Procedure(s) Performed: RIGHT TOTAL HIP ARTHROPLASTY ANTERIOR APPROACH (Right Hip)  Patient Location: PACU  Anesthesia Type:Spinal  Level of Consciousness: sedated, patient cooperative and responds to stimulation  Airway & Oxygen Therapy: Patient Spontanous Breathing and Patient connected to face mask oxygen  Post-op Assessment: Report given to RN and Post -op Vital signs reviewed and stable  Post vital signs: Reviewed and stable  Last Vitals:  Vitals Value Taken Time  BP 114/78 07/17/20 1351  Temp    Pulse 58 07/17/20 1352  Resp 13 07/17/20 1352  SpO2 97 % 07/17/20 1352  Vitals shown include unvalidated device data.  Last Pain:  Vitals:   07/17/20 0931  TempSrc: Oral  PainSc:       Patients Stated Pain Goal: 5 (07/17/20 0924)  Complications: No complications documented.

## 2020-07-18 ENCOUNTER — Encounter (HOSPITAL_COMMUNITY): Payer: Self-pay | Admitting: Orthopaedic Surgery

## 2020-07-18 ENCOUNTER — Other Ambulatory Visit (HOSPITAL_COMMUNITY): Payer: Self-pay | Admitting: Specialist

## 2020-07-18 DIAGNOSIS — M1611 Unilateral primary osteoarthritis, right hip: Secondary | ICD-10-CM | POA: Diagnosis not present

## 2020-07-18 LAB — BASIC METABOLIC PANEL
Anion gap: 10 (ref 5–15)
BUN: 19 mg/dL (ref 8–23)
CO2: 23 mmol/L (ref 22–32)
Calcium: 8.6 mg/dL — ABNORMAL LOW (ref 8.9–10.3)
Chloride: 97 mmol/L — ABNORMAL LOW (ref 98–111)
Creatinine, Ser: 1.36 mg/dL — ABNORMAL HIGH (ref 0.61–1.24)
GFR, Estimated: 58 mL/min — ABNORMAL LOW (ref >60)
Glucose, Bld: 147 mg/dL — ABNORMAL HIGH (ref 70–99)
Potassium: 4.2 mmol/L (ref 3.5–5.1)
Sodium: 130 mmol/L — ABNORMAL LOW (ref 135–145)

## 2020-07-18 LAB — CBC
HCT: 42.3 % (ref 39.0–52.0)
Hemoglobin: 13.9 g/dL (ref 13.0–17.0)
MCH: 32.2 pg (ref 26.0–34.0)
MCHC: 32.9 g/dL (ref 30.0–36.0)
MCV: 97.9 fL (ref 80.0–100.0)
Platelets: 178 10*3/uL (ref 150–400)
RBC: 4.32 MIL/uL (ref 4.22–5.81)
RDW: 13 % (ref 11.5–15.5)
WBC: 6.5 10*3/uL (ref 4.0–10.5)
nRBC: 0 % (ref 0.0–0.2)

## 2020-07-18 MED ORDER — ASPIRIN 81 MG PO CHEW: 81.0000 mg | CHEWABLE_TABLET | Freq: Every day | ORAL | 0 refills | Status: AC

## 2020-07-18 MED ORDER — METHOCARBAMOL 500 MG PO TABS
500.0000 mg | ORAL_TABLET | Freq: Four times a day (QID) | ORAL | 1 refills | Status: DC | PRN
Start: 2020-07-18 — End: 2020-07-18

## 2020-07-18 MED ORDER — OXYCODONE HCL 5 MG PO TABS
5.0000 mg | ORAL_TABLET | ORAL | 0 refills | Status: DC | PRN
Start: 2020-07-18 — End: 2020-07-18

## 2020-07-18 MED ORDER — DOCUSATE SODIUM 100 MG PO CAPS: 100.0000 mg | ORAL_CAPSULE | Freq: Two times a day (BID) | ORAL | 0 refills | Status: AC

## 2020-07-18 MED FILL — oxyCODONE HCL 5 MG TABS: 5 | 3 days supply | Qty: 40 | Fill #0

## 2020-07-18 MED FILL — METHOCARBAMOL 500 MG TABLET: 500 | 8 days supply | Qty: 30 | Fill #0

## 2020-07-18 NOTE — Progress Notes (Signed)
Subjective: 1 Day Post-Op Procedure(s) (LRB): RIGHT TOTAL HIP ARTHROPLASTY ANTERIOR APPROACH (Right) Patient reports pain as moderate.    Objective: Vital signs in last 24 hours: Temp:  [97.7 F (36.5 C)-98.7 F (37.1 C)] 98 F (36.7 C) (12/04 0946) Pulse Rate:  [48-59] 58 (12/04 0946) Resp:  [9-20] 18 (12/04 0946) BP: (102-142)/(67-107) 115/80 (12/04 0946) SpO2:  [90 %-100 %] 99 % (12/04 0946)  Intake/Output from previous day: 12/03 0701 - 12/04 0700 In: 2614.3 [P.O.:120; I.V.:2394.3; IV Piggyback:100] Out: 1100 [Urine:900; Blood:200] Intake/Output this shift: Total I/O In: -  Out: 1145 [Urine:1145]  Recent Labs    07/18/20 0348  HGB 13.9   Recent Labs    07/18/20 0348  WBC 6.5  RBC 4.32  HCT 42.3  PLT 178   Recent Labs    07/18/20 0348  NA 130*  K 4.2  CL 97*  CO2 23  BUN 19  CREATININE 1.36*  GLUCOSE 147*  CALCIUM 8.6*   No results for input(s): LABPT, INR in the last 72 hours.  Sensation intact distally Intact pulses distally Dorsiflexion/Plantar flexion intact Incision: scant drainage   Assessment/Plan: 1 Day Post-Op Procedure(s) (LRB): RIGHT TOTAL HIP ARTHROPLASTY ANTERIOR APPROACH (Right) Up with therapy Discharge home with home health    Patient's anticipated LOS is less than 2 midnights, meeting these requirements: - Younger than 65 - Lives within 1 hour of care - Has a competent adult at home to recover with post-op recover - NO history of  - Chronic pain requiring opiods  - Diabetes  - Coronary Artery Disease  - Heart failure  - Heart attack  - Stroke  - DVT/VTE  - Cardiac arrhythmia  - Respiratory Failure/COPD  - Renal failure  - Anemia  - Advanced Liver disease       Kathryne Hitch 07/18/2020, 11:50 AM

## 2020-07-18 NOTE — Progress Notes (Signed)
Physical Therapy Treatment Patient Details Name: Peter Wheeler MRN: [id number] DOB: [birth year] Today's Date: 07/18/2020    History of Present Illness Patient is 64 y.o. male s/p Rt THA anterior approach on 07/17/20 with PMH significant for HTN, GERD, CKD, OA, depression.    PT Comments    Pt received awake in bed for AM session. Being that pt experienced symptomatic orthostatic hypotension last PT session (per chart review), orthostatics were taken during today's session. Supine/beginning BP: 120/80. Supervision to get EOB, requiring icnreased time with strap on R foot and HOB slightly elevated. BP at EOB: 111/82, no s/s. Pt is min A for sit<>stand requiring cues for proper hand placement. Slow rise due to pain. Standing BP: 116/65, no s/s. All other vital signs WNL. Pt requires cues for correct gait sequence with RW during gait, min A for ~38ft. Gait speed slowly decreases with distance, due to fatigue and pain, per pt statement. Pain at 5/10 R hip. Pt tolerated therex well. Demonstrates good understanding of exercises. No significant s/o increased pain. Plan to see for PM session to navigate stairs.  Follow Up Recommendations  Follow surgeon's recommendation for DC plan and follow-up therapies;Home health PT     Equipment Recommendations  Rolling walker with 5" wheels;3in1 (PT)    Recommendations for Other Services       Precautions / Restrictions Precautions Precautions: Fall Restrictions Weight Bearing Restrictions: No Other Position/Activity Restrictions: WBAT    Mobility  Bed Mobility Overal bed mobility: Needs Assistance Bed Mobility: Supine to Sit     Supine to sit: HOB elevated;Supervision     General bed mobility comments: Pt able to perform independently with increased time and use of bed rail.  Transfers Overall transfer level: Needs assistance Equipment used: Rolling walker (2 wheeled) Transfers: Sit to/from Stand Sit to Stand: From elevated surface;Min  guard         General transfer comment: VC's for hand placement/technique with RW.  Ambulation/Gait Ambulation/Gait assistance: Min assist Gait Distance (Feet): 30 Feet Assistive device: Rolling walker (2 wheeled) Gait Pattern/deviations: Step-to pattern;Decreased stride length;Decreased weight shift to right     General Gait Details: Pt required cues for proper gait sequence. Speed declined with distance, due to a little fatigue and pain, per pt statement. No episodes of lightheadedness or dizziness   Stairs             Wheelchair Mobility    Modified Rankin (Stroke Patients Only)       Balance                                            Cognition Arousal/Alertness: Awake/alert Behavior During Therapy: WFL for tasks assessed/performed Overall Cognitive Status: Within Functional Limits for tasks assessed                                        Exercises Total Joint Exercises Ankle Circles/Pumps: AROM;Supine;20 reps Quad Sets: AROM;5 reps Gluteal Sets: AROM;5 reps;Supine Heel Slides: 5 reps;Right;Supine;AAROM Hip ABduction/ADduction: Right;5 reps;Supine;AAROM    General Comments        Pertinent Vitals/Pain Pain Assessment: 0-10 Pain Score: 5  Pain Location: Rt thigh Pain Descriptors / Indicators: Aching;Discomfort;Sore    Home Living  Prior Function            PT Goals (current goals can now be found in the care plan section) Acute Rehab PT Goals Patient Stated Goal: stop hurting PT Goal Formulation: With patient Time For Goal Achievement: 07/24/20 Potential to Achieve Goals: Good Progress towards PT goals: Progressing toward goals    Frequency    7X/week      PT Plan      Co-evaluation              AM-PAC PT "6 Clicks" Mobility   Outcome Measure  Help needed turning from your back to your side while in a flat bed without using bedrails?: None Help needed  moving from lying on your back to sitting on the side of a flat bed without using bedrails?: None Help needed moving to and from a bed to a chair (including a wheelchair)?: A Little Help needed standing up from a chair using your arms (e.g., wheelchair or bedside chair)?: A Little Help needed to walk in hospital room?: A Little Help needed climbing 3-5 steps with a railing? : A Lot 6 Click Score: 19    End of Session Equipment Utilized During Treatment: Gait belt Activity Tolerance: Patient tolerated treatment well Patient left: in chair;with call bell/phone within reach;with chair alarm set Nurse Communication: Mobility status;Patient requests pain meds PT Visit Diagnosis: Muscle weakness (generalized) (M62.81);Difficulty in walking, not elsewhere classified (R26.2);Pain Pain - Right/Left: Right Pain - part of body: Hip     Time: 1005-1035 PT Time Calculation (min) (ACUTE ONLY): 30 min  Charges:  $Gait Training: 8-22 mins $Therapeutic Exercise: 8-22 mins                     C. Alinda Dooms, SPTA [facility] [phone]

## 2020-07-18 NOTE — Progress Notes (Addendum)
     Subjective: 1 Day Post-Op Procedure(s) (LRB): RIGHT TOTAL HIP ARTHROPLASTY ANTERIOR APPROACH (Right) Awake, alert and oriented x 4. Complains of moderate pain, standing and walking short distance. PT to work with again later this morning then probable discharge.  Patient reports pain as moderate.    Objective:   VITALS:  Temp:  [97.7 F (36.5 C)-98.7 F (37.1 C)] 98.6 F (37 C) (12/04 0336) Pulse Rate:  [48-59] 59 (12/04 0336) Resp:  [9-20] 17 (12/04 0336) BP: (102-142)/(67-107) 117/75 (12/04 0336) SpO2:  [90 %-100 %] 96 % (12/04 0336)  Neurologically intact ABD soft Neurovascular intact Sensation intact distally Intact pulses distally Dorsiflexion/Plantar flexion intact Incision: dressing C/D/I, no drainage and scant drainage Compartment soft   LABS Recent Labs    07/18/20 0348  HGB 13.9  WBC 6.5  PLT 178   Recent Labs    07/18/20 0348  NA 130*  K 4.2  CL 97*  CO2 23  BUN 19  CREATININE 1.36*  GLUCOSE 147*   No results for input(s): LABPT, INR in the last 72 hours.   Assessment/Plan: 1 Day Post-Op Procedure(s) (LRB): RIGHT TOTAL HIP ARTHROPLASTY ANTERIOR APPROACH (Right)  Advance diet Up with therapy D/C IV fluids Discharge home with home health Rx and orders for discharge completed. Vira Browns 07/18/2020, 9:46 AMPatient ID: Peter Wheeler, male   DOB: 09-19-1955, 64 y.o.   MRN: [id number]

## 2020-07-18 NOTE — Discharge Summary (Signed)
Patient ID: Peter Wheeler MRN: [id number] DOB/AGE: Sep 05, 1955 64 y.o.  Admit date: 07/17/2020 Discharge date: 07/18/2020  Admission Diagnoses:  Principal Problem:   Unilateral primary osteoarthritis, right hip Active Problems:   Status post total replacement of right hip   Discharge Diagnoses:  Same  Past Medical History:  Diagnosis Date  . Arthritis   . Chronic kidney disease    FOLLOWED BY NEPHROLOGIST - dr Jackelyn Poling  . Depression   . GERD (gastroesophageal reflux disease)   . Hypertension     Surgeries: Procedure(s): RIGHT TOTAL HIP ARTHROPLASTY ANTERIOR APPROACH on 07/17/2020   Consultants:   Discharged Condition: Improved  Hospital Course: Peter Wheeler is an 64 y.o. male who was admitted 07/17/2020 for operative treatment ofUnilateral primary osteoarthritis, right hip. Patient has severe unremitting pain that affects sleep, daily activities, and work/hobbies. After pre-op clearance the patient was taken to the operating room on 07/17/2020 and underwent  Procedure(s): RIGHT TOTAL HIP ARTHROPLASTY ANTERIOR APPROACH.    Patient was given perioperative antibiotics:  Anti-infectives (From admission, onward)   Start     Dose/Rate Route Frequency Ordered Stop   07/17/20 1800  ceFAZolin (ANCEF) IVPB 1 g/50 mL premix        1 g 100 mL/hr over 30 Minutes Intravenous Every 6 hours 07/17/20 1648 07/17/20 2350   07/17/20 0915  ceFAZolin (ANCEF) IVPB 2g/100 mL premix        2 g 200 mL/hr over 30 Minutes Intravenous On call to O.R. 07/17/20 0905 07/17/20 1201       Patient was given sequential compression devices, early ambulation, and chemoprophylaxis to prevent DVT.  Patient benefited maximally from hospital stay and there were no complications.    Recent vital signs:  Patient Vitals for the past 24 hrs:  BP Temp Temp src Pulse Resp SpO2  07/18/20 0946 115/80 98 F (36.7 C) Oral (!) 58 18 99 %  07/18/20 0336 117/75 98.6 F (37 C) Oral (!) 59 17 96 %   07/18/20 0002 113/79 98.7 F (37.1 C) Oral (!) 56 18 93 %  07/17/20 2100 119/76 97.7 F (36.5 C) Oral (!) 55 17 94 %  07/17/20 1944 117/80 97.7 F (36.5 C) Oral (!) 58 18 90 %  07/17/20 1843 116/79 98 F (36.7 C) Oral (!) 51 18 100 %  07/17/20 1637 (!) 142/80 97.8 F (36.6 C) Oral (!) 52 14 100 %  07/17/20 1600 120/75 98.2 F (36.8 C) -- (!) 50 (!) 9 98 %  07/17/20 1545 112/71 -- -- (!) 50 11 98 %  07/17/20 1530 126/77 -- -- (!) 50 12 97 %  07/17/20 1515 (!) 134/107 -- -- (!) 48 14 97 %  07/17/20 1500 130/82 -- -- (!) 48 13 96 %  07/17/20 1445 125/74 -- -- (!) 50 16 100 %  07/17/20 1430 124/79 -- -- (!) 50 (!) 9 100 %  07/17/20 1415 108/90 -- -- (!) 56 20 100 %  07/17/20 1400 102/67 -- -- (!) 56 16 98 %  07/17/20 1351 114/78 97.9 F (36.6 C) -- (!) 59 14 96 %     Recent laboratory studies:  Recent Labs    07/18/20 0348  WBC 6.5  HGB 13.9  HCT 42.3  PLT 178  NA 130*  K 4.2  CL 97*  CO2 23  BUN 19  CREATININE 1.36*  GLUCOSE 147*  CALCIUM 8.6*     Discharge Medications:   Allergies as of 07/18/2020   No Known Allergies  Medication List    TAKE these medications   amLODipine 5 MG tablet Commonly known as: NORVASC Take 5 mg by mouth daily.   aspirin 81 MG chewable tablet Chew 1 tablet (81 mg total) by mouth daily.   atenolol 50 MG tablet Commonly known as: TENORMIN Take 50 mg by mouth daily.   atorvastatin 20 MG tablet Commonly known as: LIPITOR Take 20 mg by mouth at bedtime.   calcium carbonate 1500 (600 Ca) MG Tabs tablet Commonly known as: OSCAL Take 600 mg of elemental calcium by mouth 2 (two) times daily with a meal.   calcium carbonate 500 MG chewable tablet Commonly known as: TUMS - dosed in mg elemental calcium Chew 1 tablet by mouth daily as needed for indigestion or heartburn.   COLD AND FLU PO Take 1 application by mouth daily as needed (Cold and flu).   docusate sodium 100 MG capsule Commonly known as: COLACE Take 1 capsule  (100 mg total) by mouth 2 (two) times daily.   famotidine 40 MG tablet Commonly known as: PEPCID Take 40 mg by mouth 2 (two) times daily.   gabapentin 600 MG tablet Commonly known as: NEURONTIN Take 600 mg by mouth 2 (two) times daily.   GLUCOSAMINE-CHONDROITIN PO Take 1 tablet by mouth in the morning and at bedtime. 1.500 1.200   lisinopril 40 MG tablet Commonly known as: ZESTRIL Take 40 mg by mouth daily.   Magnesium Oxide 420 (252 Mg) MG Tabs Take 420 mg by mouth at bedtime.   methocarbamol 500 MG tablet Commonly known as: ROBAXIN Take 1 tablet (500 mg total) by mouth every 6 (six) hours as needed for muscle spasms.   mirtazapine 45 MG tablet Commonly known as: REMERON Take 45 mg by mouth at bedtime.   oxyCODONE 5 MG immediate release tablet Commonly known as: Oxy IR/ROXICODONE Take 1-2 tablets (5-10 mg total) by mouth every 4 (four) hours as needed for moderate pain (pain score 4-6).   tamsulosin 0.4 MG Caps capsule Commonly known as: FLOMAX Take 0.4 mg by mouth at bedtime.   venlafaxine 75 MG tablet Commonly known as: EFFEXOR Take 225 mg by mouth daily.   vitamin B-12 500 MCG tablet Commonly known as: CYANOCOBALAMIN Take 1,000 mcg by mouth daily.            Durable Medical Equipment  (From admission, onward)         Start     Ordered   07/17/20 1648  DME 3 n 1  Once        07/17/20 1648   07/17/20 1648  DME Walker rolling  Once       Question Answer Comment  Walker: With 5 Inch Wheels   Patient needs a walker to treat with the following condition Status post total replacement of right hip      07/17/20 1648          Diagnostic Studies: DG Pelvis Portable  Result Date: 07/17/2020 CLINICAL DATA:  64 year old male status post right hip arthroplasty. EXAM: PORTABLE PELVIS 1-2 VIEWS COMPARISON:  Pelvic radiograph dated 04/08/2020. FINDINGS: There is a total right hip arthroplasty. The arthroplasty components appear intact and in anatomic  alignment. There is no acute fracture or dislocation. Mild arthritic changes of the left hip. Postsurgical changes in the soft tissues of the right hip with cutaneous clips. IMPRESSION: Status post total right hip arthroplasty. Electronically Signed   By: Elgie Collard M.D.   On: 07/17/2020 16:13   DG C-Arm  1-60 Min-No Report  Result Date: 07/17/2020 Fluoroscopy was utilized by the requesting physician.  No radiographic interpretation.   DG HIP OPERATIVE UNILAT W OR W/O PELVIS RIGHT  Result Date: 07/17/2020 CLINICAL DATA:  Right anterior hip EXAM: OPERATIVE RIGHT HIP (WITH PELVIS IF PERFORMED) 3 VIEWS TECHNIQUE: Fluoroscopic spot image(s) were submitted for interpretation post-operatively. COMPARISON:  Radiograph 04/08/2020 FINDINGS: Intraoperative fluoroscopic views demonstrate interval placement of a total right hip arthroplasty including a collared femoral stem and a screw fixed acetabular component which appear in expected alignment with typical postsurgical soft tissue changes including soft tissue and intra-articular gas. Views include imaging of the contralateral hip which demonstrate some mild osteoarthrosis similar to comparison imaging. No acute complications are evident. IMPRESSION: Status post total right hip arthroplasty without acute complication. Electronically Signed   By: Kreg Shropshire M.D.   On: 07/17/2020 15:24    Disposition: Discharge disposition: 01-Home or Self Care       Discharge Instructions    Call MD / Call 911   Complete by: As directed    If you experience chest pain or shortness of breath, CALL 911 and be transported to the hospital emergency room.  If you develope a fever above 101 F, pus (white drainage) or increased drainage or redness at the wound, or calf pain, call your surgeon's office.   Constipation Prevention   Complete by: As directed    Drink plenty of fluids.  Prune juice may be helpful.  You may use a stool softener, such as Colace (over the  counter) 100 mg twice a day.  Use MiraLax (over the counter) for constipation as needed.   Diet - low sodium heart healthy   Complete by: As directed    Discharge instructions   Complete by: As directed    INSTRUCTIONS AFTER JOINT REPLACEMENT   Remove items at home which could result in a fall. This includes throw rugs or furniture in walking pathways ICE to the affected joint every three hours while awake for 30 minutes at a time, for at least the first 3-5 days, and then as needed for pain and swelling.  Continue to use ice for pain and swelling. You may notice swelling that will progress down to the foot and ankle.  This is normal after surgery.  Elevate your leg when you are not up walking on it.   Continue to use the breathing machine you got in the hospital (incentive spirometer) which will help keep your temperature down.  It is common for your temperature to cycle up and down following surgery, especially at night when you are not up moving around and exerting yourself.  The breathing machine keeps your lungs expanded and your temperature down.   DIET:  As you were doing prior to hospitalization, we recommend a well-balanced diet.  DRESSING / WOUND CARE / SHOWERING  Keep the surgical dressing until follow up.  The dressing is water proof, so you can shower without any extra covering.  IF THE DRESSING FALLS OFF or the wound gets wet inside, change the dressing with sterile gauze.  Please use good hand washing techniques before changing the dressing.  Do not use any lotions or creams on the incision until instructed by your surgeon.    ACTIVITY  Increase activity slowly as tolerated, but follow the weight bearing instructions below.   No driving for 6 weeks or until further direction given by your physician.  You cannot drive while taking narcotics.  No lifting or carrying  greater than 10 lbs. until further directed by your surgeon. Avoid periods of inactivity such as sitting longer than  an hour when not asleep. This helps prevent blood clots.  You may return to work once you are authorized by your doctor.     WEIGHT BEARING   Weight bearing as tolerated with assist device (walker, cane, etc) as directed, use it as long as suggested by your surgeon or therapist, typically at least 4-6 weeks.   EXERCISES  Results after joint replacement surgery are often greatly improved when you follow the exercise, range of motion and muscle strengthening exercises prescribed by your doctor. Safety measures are also important to protect the joint from further injury. Any time any of these exercises cause you to have increased pain or swelling, decrease what you are doing until you are comfortable again and then slowly increase them. If you have problems or questions, call your caregiver or physical therapist for advice.   Rehabilitation is important following a joint replacement. After just a few days of immobilization, the muscles of the leg can become weakened and shrink (atrophy).  These exercises are designed to build up the tone and strength of the thigh and leg muscles and to improve motion. Often times heat used for twenty to thirty minutes before working out will loosen up your tissues and help with improving the range of motion but do not use heat for the first two weeks following surgery (sometimes heat can increase post-operative swelling).   These exercises can be done on a training (exercise) mat, on the floor, on a table or on a bed. Use whatever works the best and is most comfortable for you.    Use music or television while you are exercising so that the exercises are a pleasant break in your day. This will make your life better with the exercises acting as a break in your routine that you can look forward to.   Perform all exercises about fifteen times, three times per day or as directed.  You should exercise both the operative leg and the other leg as well.  Exercises include:    Quad Sets - Tighten up the muscle on the front of the thigh (Quad) and hold for 5-10 seconds.   Straight Leg Raises - With your knee straight (if you were given a brace, keep it on), lift the leg to 60 degrees, hold for 3 seconds, and slowly lower the leg.  Perform this exercise against resistance later as your leg gets stronger.  Leg Slides: Lying on your back, slowly slide your foot toward your buttocks, bending your knee up off the floor (only go as far as is comfortable). Then slowly slide your foot back down until your leg is flat on the floor again.  Angel Wings: Lying on your back spread your legs to the side as far apart as you can without causing discomfort.  Hamstring Strength:  Lying on your back, push your heel against the floor with your leg straight by tightening up the muscles of your buttocks.  Repeat, but this time bend your knee to a comfortable angle, and push your heel against the floor.  You may put a pillow under the heel to make it more comfortable if necessary.   A rehabilitation program following joint replacement surgery can speed recovery and prevent re-injury in the future due to weakened muscles. Contact your doctor or a physical therapist for more information on knee rehabilitation.    CONSTIPATION  Constipation  is defined medically as fewer than three stools per week and severe constipation as less than one stool per week.  Even if you have a regular bowel pattern at home, your normal regimen is likely to be disrupted due to multiple reasons following surgery.  Combination of anesthesia, postoperative narcotics, change in appetite and fluid intake all can affect your bowels.   YOU MUST use at least one of the following options; they are listed in order of increasing strength to get the job done.  They are all available over the counter, and you may need to use some, POSSIBLY even all of these options:    Drink plenty of fluids (prune juice may be helpful) and high  fiber foods Colace 100 mg by mouth twice a day  Senokot for constipation as directed and as needed Dulcolax (bisacodyl), take with full glass of water  Miralax (polyethylene glycol) once or twice a day as needed.  If you have tried all these things and are unable to have a bowel movement in the first 3-4 days after surgery call either your surgeon or your primary doctor.    If you experience loose stools or diarrhea, hold the medications until you stool forms back up.  If your symptoms do not get better within 1 week or if they get worse, check with your doctor.  If you experience "the worst abdominal pain ever" or develop nausea or vomiting, please contact the office immediately for further recommendations for treatment.   ITCHING:  If you experience itching with your medications, try taking only a single pain pill, or even half a pain pill at a time.  You can also use Benadryl over the counter for itching or also to help with sleep.   TED HOSE STOCKINGS:  Use stockings on both legs until for at least 2 weeks or as directed by physician office. They may be removed at night for sleeping.  MEDICATIONS:  See your medication summary on the "After Visit Summary" that nursing will review with you.  You may have some home medications which will be placed on hold until you complete the course of blood thinner medication.  It is important for you to complete the blood thinner medication as prescribed.  PRECAUTIONS:  If you experience chest pain or shortness of breath - call 911 immediately for transfer to the hospital emergency department.   If you develop a fever greater that 101 F, purulent drainage from wound, increased redness or drainage from wound, foul odor from the wound/dressing, or calf pain - CONTACT YOUR SURGEON.                                                   FOLLOW-UP APPOINTMENTS:  If you do not already have a post-op appointment, please call the office for an appointment to be seen by  your surgeon.  Guidelines for how soon to be seen are listed in your "After Visit Summary", but are typically between 1-4 weeks after surgery.  OTHER INSTRUCTIONS:   Knee Replacement:  Do not place pillow under knee, focus on keeping the knee straight while resting. CPM instructions: 0-90 degrees, 2 hours in the morning, 2 hours in the afternoon, and 2 hours in the evening. Place foam block, curve side up under heel at all times except when in CPM or when walking.  DO NOT modify, tear, cut, or change the foam block in any way.   DENTAL ANTIBIOTICS:  In most cases prophylactic antibiotics for Dental procdeures after total joint surgery are not necessary.  Exceptions are as follows:  1. History of prior total joint infection  2. Severely immunocompromised (Organ Transplant, cancer chemotherapy, Rheumatoid biologic meds such as Humera)  3. Poorly controlled diabetes (A1C &gt; 8.0, blood glucose over 200)  If you have one of these conditions, contact your surgeon for an antibiotic prescription, prior to your dental procedure.   MAKE SURE YOU:  Understand these instructions.  Get help right away if you are not doing well or get worse.    Thank you for letting us be a part of your medical care team.  It is a privilege we respect greatly.  We hope these instructions will help you stay on track for a fast and full recovery!  Dental Antibiotics:  In most cases prophylactic antibiotics for Dental procdeures after total joint surgery are not necessary.  Exceptions are as follows:  1. History of prior total joint infection  2. Severely immunocompromised (Organ Transplant, cancer chemotherapy, Rheumatoid biologic meds such as Humera)  3. Poorly controlled diabetes (A1C &gt; 8.0, blood glucose over 200)  If you have one of these conditions, contact your surgeon for an antibiotic prescription, prior to your dental procedure.   Driving restrictions   Complete by: As directed    No  driving for 2 weeks   Increase activity slowly as tolerated   Complete by: As directed    Lifting restrictions   Complete by: As directed    No lifting for 6 weeks       Follow-up Information    Kathryne Hitch, MD Follow up in 2 week(s).   Specialty: Orthopedic Surgery Contact information: [address] [place] [address] [phone]        [facility] Follow up.   Specialty: Home Health Services Why: agency will provide home health physical therapy Contact information: [address] [place] [address] [phone]                Signed: Kathryne Hitch 07/18/2020, 11:51 AM

## 2020-07-18 NOTE — Progress Notes (Cosign Needed Addendum)
Physical Therapy Treatment Patient Details Name: Peter Wheeler MRN: [id number] DOB: [birth year] Today's Date: 07/18/2020    History of Present Illness Patient is 64 y.o. male s/p Rt THA anterior approach on 07/17/20 with PMH significant for HTN, GERD, CKD, OA, depression.    PT Comments    Pt received in chair for PM session. Pt is wheeled into the hallway to the stairwell to practice ascending/descending stairs with 1 crutch and handrail. Min G to sit<>stand from his chair; he is able to recall correct R LE and hand placement. Pt ambulated min G for ~5 feet to get to base of stairs. Therapist demonstrates correct sequence going up and down on stairs with crutch and handrail. Pt able to recollect this well when performing himself. He attempts to step up before being close enough to first stair; this is corrected with VCs. Pt completes 14 stairs ascending with no required rest breaks until the top; seated rest break in chair. Pt is educated on how to sit on the stairs if there isn't a chair around. Pt demos understanding. Pt descends 14 stairs with only 1 incorrect sequence throughout. Ambulates ~141ft back to room from stairwell with chair follow for safety. TE completed. Pt tolerated treatment well. Pt is clear for D/C with PT; nursing notified.  Follow Up Recommendations  Follow surgeon's recommendation for DC plan and follow-up therapies;Home health PT     Equipment Recommendations  Rolling walker with 5" wheels;3in1 (PT)    Recommendations for Other Services       Precautions / Restrictions Precautions Precautions: Fall Restrictions Weight Bearing Restrictions: No Other Position/Activity Restrictions: WBAT    Mobility  Bed Mobility Overal bed mobility: Needs Assistance Bed Mobility: Supine to Sit     Supine to sit: HOB elevated;Supervision     General bed mobility comments: Pt found and left in chair  Transfers Overall transfer level: Needs assistance Equipment used:  Rolling walker (2 wheeled) Transfers: Sit to/from Stand Sit to Stand: Min guard         General transfer comment: Pt able to recall correct hand and R LE placement during sit<>stand  Ambulation/Gait Ambulation/Gait assistance: Min assist Gait Distance (Feet): 100 Feet Assistive device: Rolling walker (2 wheeled) Gait Pattern/deviations: Step-to pattern;Decreased stride length;Decreased weight shift to right Gait velocity: decr   General Gait Details: Pt ambulated from stairwell to room min G for safety. No LOB or knee buckling observed.   Stairs Stairs: Yes Stairs assistance: Min assist Stair Management: One rail Right;Backwards;Forwards;With crutches (1 crutch, 1 handrail) Number of Stairs: 14 (performed 14 stairs ascending and descending) General stair comments: Pt performed entire flight of stairs in preparation for D/C home, as pt has 3 flights of stairs to enter his apt home. He required no physical assistance to complete 14 stairs ascending/descending. Sitting rest break taken at the top before descending.   Wheelchair Mobility    Modified Rankin (Stroke Patients Only)       Balance                                            Cognition Arousal/Alertness: Awake/alert Behavior During Therapy: WFL for tasks assessed/performed Overall Cognitive Status: Within Functional Limits for tasks assessed  Exercises Total Joint Exercises Ankle Circles/Pumps: AROM;20 reps;Both;Supine Quad Sets: 5 reps;AROM;Both;Supine Gluteal Sets: AROM;5 reps;Supine;Both Heel Slides: 5 reps;Right;Supine;AAROM Hip ABduction/ADduction: Right;5 reps;Supine;AAROM Long Arc Quad: AROM;AAROM;Right;5 reps;Seated    General Comments        Pertinent Vitals/Pain Pain Assessment: 0-10 Pain Score: 5  Pain Location: Rt thigh Pain Descriptors / Indicators: Aching;Discomfort;Sore Pain Intervention(s): Monitored during  session;Ice applied    Home Living                      Prior Function            PT Goals (current goals can now be found in the care plan section) Acute Rehab PT Goals Patient Stated Goal: stop hurting PT Goal Formulation: With patient Time For Goal Achievement: 07/24/20 Potential to Achieve Goals: Good Progress towards PT goals: Progressing toward goals    Frequency    7X/week      PT Plan      Co-evaluation              AM-PAC PT "6 Clicks" Mobility   Outcome Measure  Help needed turning from your back to your side while in a flat bed without using bedrails?: None Help needed moving from lying on your back to sitting on the side of a flat bed without using bedrails?: None Help needed moving to and from a bed to a chair (including a wheelchair)?: None Help needed standing up from a chair using your arms (e.g., wheelchair or bedside chair)?: None Help needed to walk in hospital room?: None Help needed climbing 3-5 steps with a railing? : A Little 6 Click Score: 23    End of Session Equipment Utilized During Treatment: Gait belt Activity Tolerance: Patient tolerated treatment well Patient left: in chair;with call bell/phone within reach;with chair alarm set Nurse Communication: Mobility status PT Visit Diagnosis: Muscle weakness (generalized) (M62.81);Difficulty in walking, not elsewhere classified (R26.2);Pain Pain - Right/Left: Right Pain - part of body: Hip     Time: 1330-1403 PT Time Calculation (min) (ACUTE ONLY): 30 min  Charges:  1 gt    1 ta                     C. Alinda Dooms, SPTA [facility] [phone]  The above session was supervised and agree with documentation  Felecia Shelling  PTA Acute  Rehabilitation Services Pager      [phone] Office      [phone]

## 2020-07-18 NOTE — TOC Progression Note (Signed)
Transition of Care [facility]) - Progression Note    Patient Details  Name: Peter Wheeler MRN: [id number] Date of Birth: [birth year]  Transition of Care [facility]) CM/SW Contact  Armanda Heritage, RN Phone Number: 07/18/2020, 10:29 AM  Clinical Narrative:    Patient set up with [facility] for HHPT, [employer] to provide dme rolling walker and 3in1.    Expected Discharge Plan: Home w Home Health Services Barriers to Discharge: No Barriers Identified  Expected Discharge Plan and Services Expected Discharge Plan: Home w Home Health Services   Discharge Planning Services: CM Consult Post Acute Care Choice: Home Health Living arrangements for the past 2 months: Single Family Home Expected Discharge Date: 07/18/20               DME Arranged: Dan Humphreys rolling, 3-N-1 DME Agency: [employer] Date DME Agency Contacted: 07/18/20 Time DME Agency Contacted: 1026 Representative spoke with at DME Agency: Kathline Magic HH Arranged: PT HH Agency: [facility]     Representative spoke with at [facility] Agency: pre-arranged in MD office   Social Determinants of Health (SDOH) Interventions    Readmission Risk Interventions No flowsheet data found.

## 2020-07-18 NOTE — Op Note (Signed)
NAME: Peter Wheeler, Peter Wheeler [facility] MEDICAL RECORD ID:[id number] ACCOUNT [id number] DATE OF BIRTH:1956-03-27 FACILITY: [facility] LOCATION: [facility] PHYSICIAN:Mylena Sedberry Aretha Parrot, MD  OPERATIVE REPORT  DATE OF PROCEDURE:  07/17/2020  PREOPERATIVE DIAGNOSES:  Posttraumatic arthritis and degenerative joint disease, right hip.  POSTOPERATIVE DIAGNOSES:  Posttraumatic arthritis and degenerative joint disease, right hip.  PROCEDURE:  Right total hip arthroplasty through direct anterior approach.  IMPLANTS:  DePuy Sector Gription acetabular component size 56, size 36+4 neutral polyethylene liner, 1 single screw in the acetabulum, size 12 Corail femoral component with high offset, size 36-2 metal hip ball.  SURGEON:  Vanita Panda. Magnus Ivan, MD  ASSISTANT:  Richardean Canal, PA-C  ANESTHESIA:  Spinal.  ANTIBIOTICS:  Two grams IV Ancef.  BLOOD LOSS:  200 mL.  COMPLICATIONS:  None.  INDICATIONS:  The patient is a 64 year old veteran who presented to me in the office earlier this year with debilitating end-stage arthritis involving his right hip.  This is actually posttraumatic arthritis.  In 2016, he sustained an acetabular fracture  of his right hip socket.  This was treated nonoperatively and it was appropriate because there was not any significant displacement.  However, he developed significant posttraumatic arthritis and even loose bodies that could be seen on plain film around  his hip joint.  His pain has become daily and it is debilitating and detrimentally affecting his mobility, his quality of life and his activities of daily living.  We have recommended hip replacement surgery and he agrees with this as well.  I did talk  to him in length about the risk of acute blood loss anemia, nerve and vessel injury, fracture, infection, dislocation, DVT, implant failure and skin and soft tissue issues.  I talked about the goals being decrease pain, improve mobility and overall  improve quality of  life.  DESCRIPTION OF PROCEDURE:  After informed consent was obtained, appropriate right hip was marked.  He was brought to the operating room and sat up on the stretcher where spinal anesthesia was obtained.  He was laid in the supine position on the  stretcher.  Foley catheter was placed.  I was able to really get a good assessment of his leg lengths and he is only really just maybe a millimeter shorter on the right than the left.  Traction boots were placed on both his feet.  Next, I placed him  supine on the Hana fracture table, the perineal post in place and both legs in line skeletal traction device and no traction applied.  His right operative hip was prepped and draped with DuraPrep and sterile drapes.  A timeout was called.  He was  identified as correct patient, correct right hip.  I then made an incision just inferior and posterior to the anterior superior iliac spine and carried this obliquely down the leg.  I dissected down the tensor fascia lata muscle and the tensor fascia was  then divided longitudinally to proceed with direct anterior approach to the hip.  I identified and cauterized circumflex vessels and identified the hip capsule, opened the hip capsule in an L-type format, finding a moderate joint effusion.  I placed  Cobra retractors within the medial and lateral joint capsule and the femoral neck and made our femoral neck cut with an oscillating saw just proximal to the lesser trochanter and completed this with an osteotome.  We placed a corkscrew guide in the  femoral head and removed the femoral head in its entirety and found a wide area devoid of cartilage.  Of note, we also found multiple loose bodies, all in the joint itself and at least 13 loose bodies.  I then placed a bent Hohmann over the medial  acetabular rim and removed remnants of the acetabular labrum and other periarticular osteophytes around the hip socket.  I then began reaming under direct visualization from a size  43 reamer in stepwise increments up to a size 55, with all reamers under  direct visualization, the last reamer under direct fluoroscopy, so we could obtain our depth of reaming, our inclination and anteversion.  We were having to go with really a rim fit based on he was significantly medialized from his previous trauma.  We  were able to put a DePuy Sector Gription acetabular component size 56, in what we felt was appropriate inclination and anteversion and I still placed a single screw, given the sclerotic nature of his hip socket and again this was very tight rim fit and I  could not get it to move any further.  I still ended up going with a 36+4 polyethylene liner to give him more offset.  Attention was then turned to the femur.  With the leg externally rotated to 120 degrees, extended and adducted, we were able to place  the Mueller retractor medially and Hohmann retractor behind the greater trochanter.  We released lateral joint capsule and used a box-cutting osteotome to enter the femoral canal and a rongeur to lateralize, then began broaching using the Corail  broaching system from a size 8 up to a size 12.  With the size 12 in place, we trialed a standard offset femoral neck and a 36-2 hip ball and reduced this in the acetabulum and he was close to being equal in leg length but he definitely needed more  offset.  I did feel that the actual Corail stem would grab a little bit harder, I lateralized it a little bit more.  This was done after removing trial components.  I then placed the real Corail femoral component size 12 but we went with a high offset  stem.  We got that down into the femoral shaft.  It was definitely proud, but I still felt comfortable going with a 36-2 hip ball.  We reduced this in the acetabulum and it was stable throughout its arc of motion, assessed radiographically and  clinically.  We did increase his leg length and maximize his offset as best we could.  We then irrigated the  soft tissue with normal saline solution using pulsatile lavage.  I closed the remnants of the joint capsule with interrupted #1 Ethibond suture,  followed by running #1 Vicryl to close the tensor fascia, 0 Vicryl was used to close deep tissue and 2-0 Vicryl was used to close subcutaneous tissue.  The skin was reapproximated with staples.  An Aquacel dressing was applied.  He was taken off the Hana  table and taken to recovery room in stable condition, with all final counts being correct.  No complications noted.  Of note, Rexene Edison, PA-C, assisted during the entire case.  His assistance was crucial for facilitating all aspects of this case.  HN/NUANCE  D:07/17/2020 T:07/18/2020 JOB:[id number]

## 2020-07-18 NOTE — Discharge Instructions (Signed)

## 2020-07-20 ENCOUNTER — Encounter (HOSPITAL_COMMUNITY): Payer: Self-pay | Admitting: Orthopaedic Surgery

## 2020-07-22 ENCOUNTER — Encounter: Payer: Self-pay | Admitting: Orthopaedic Surgery

## 2020-07-30 ENCOUNTER — Encounter: Payer: Self-pay | Admitting: Orthopaedic Surgery

## 2020-07-30 ENCOUNTER — Ambulatory Visit (INDEPENDENT_AMBULATORY_CARE_PROVIDER_SITE_OTHER): Payer: No Typology Code available for payment source | Admitting: Orthopaedic Surgery

## 2020-07-30 DIAGNOSIS — Z96641 Presence of right artificial hip joint: Secondary | ICD-10-CM

## 2020-07-30 NOTE — Progress Notes (Signed)
Peter Wheeler is 2 weeks tomorrow status post a right total hip arthroplasty.  His surgery went very well.  We found a very large number of loose bodies within his hip joint.  He is ambulate with a cane and doing well.  He reports increased range of motion and increased strength with his right hip.  His incision looks good so I remove the staples and placed Steri-Strips.  His leg lengths look good.  There is no significant seroma and only mild hematoma.  He looks good on his balance when he stands as well.  All questions and concerns were answered and addressed.  I would like to see him back in 4 weeks for repeat exam but no x-rays are needed unless there are issues.  He is already not taking aspirin and only taking Tylenol for discomfort and pain.

## 2020-08-27 ENCOUNTER — Encounter: Payer: Self-pay | Admitting: Orthopaedic Surgery

## 2020-08-27 ENCOUNTER — Ambulatory Visit (INDEPENDENT_AMBULATORY_CARE_PROVIDER_SITE_OTHER): Payer: No Typology Code available for payment source | Admitting: Orthopaedic Surgery

## 2020-08-27 DIAGNOSIS — Z96641 Presence of right artificial hip joint: Secondary | ICD-10-CM

## 2020-08-27 NOTE — Progress Notes (Signed)
The patient is now 5 weeks status post a right total hip arthroplasty.  He says he has good range of motion and strength and he ambulates with a cane on occasion.  Examination of his right hip shows it moves smoothly and fluidly.  There are some subjective decrease sensation around the incision.  His leg lengths are equal.  His incision looks good overall.  We had a long and thorough discussion about what he can and cannot do.  I am fine with him submerging underwater.  I am also fine with him playing golf.  I would like to see him back in 6 months with a standing low AP pelvis and lateral of his right operative hip.  If there is any issues before then he knows to let us know.

## 2020-08-28 ENCOUNTER — Encounter: Payer: Self-pay | Admitting: Orthopaedic Surgery

## 2021-02-09 IMAGING — RF DG C-ARM 1-60 MIN-NO REPORT
1 series · 4 of 4 positions shown · non-contrast
Comparison: Radiograph 04/08/2020

CLINICAL DATA: Right anterior hip

EXAM:
OPERATIVE RIGHT HIP (WITH PELVIS IF PERFORMED) 3 VIEWS
TECHNIQUE: Fluoroscopic spot image(s) were submitted for interpretation
post-operatively.

[Series 1: unknown protocol · 4 of 4 slices shown]
[im 1/4]
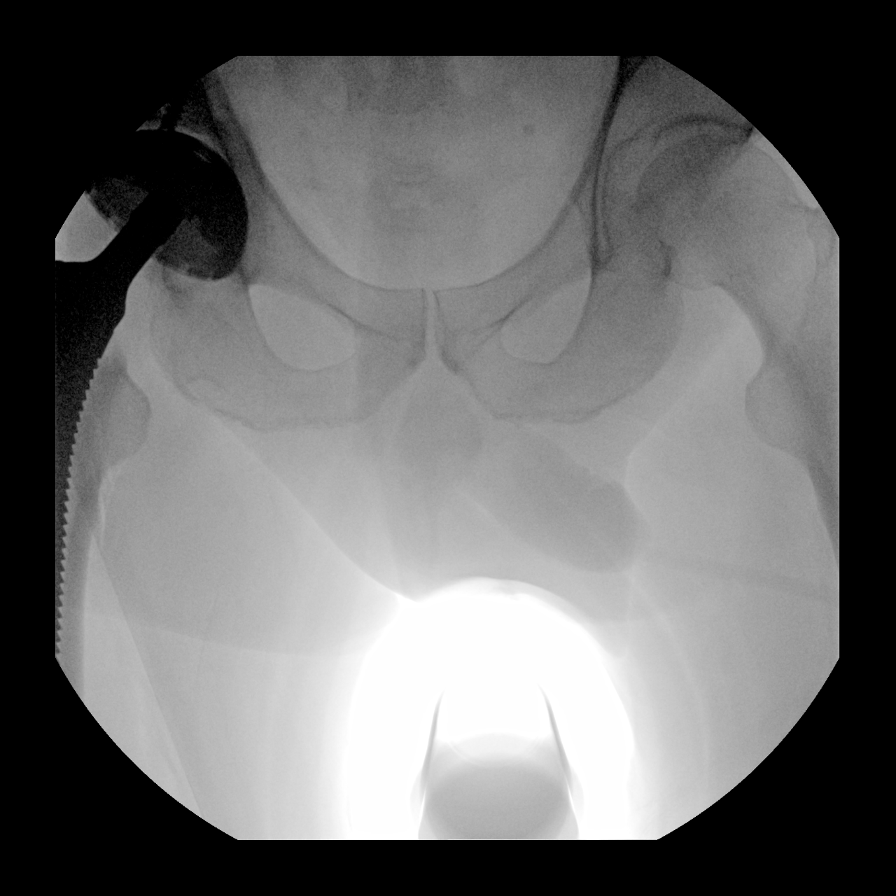
[im 2/4]
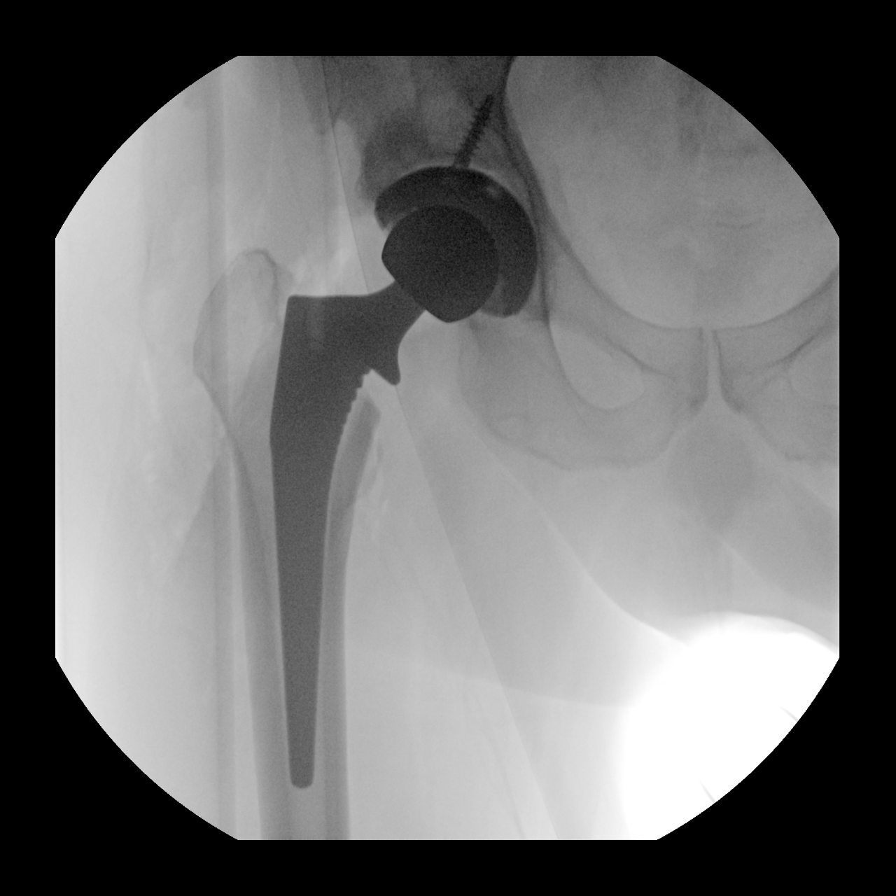
[im 3/4]
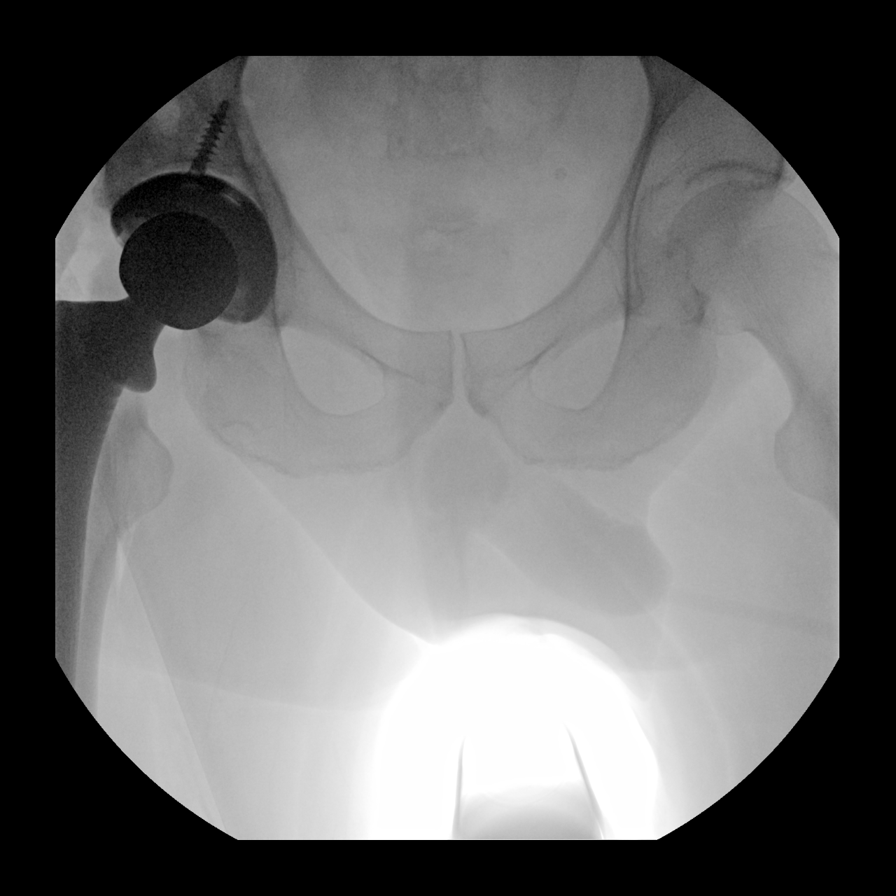
[im 4/4]
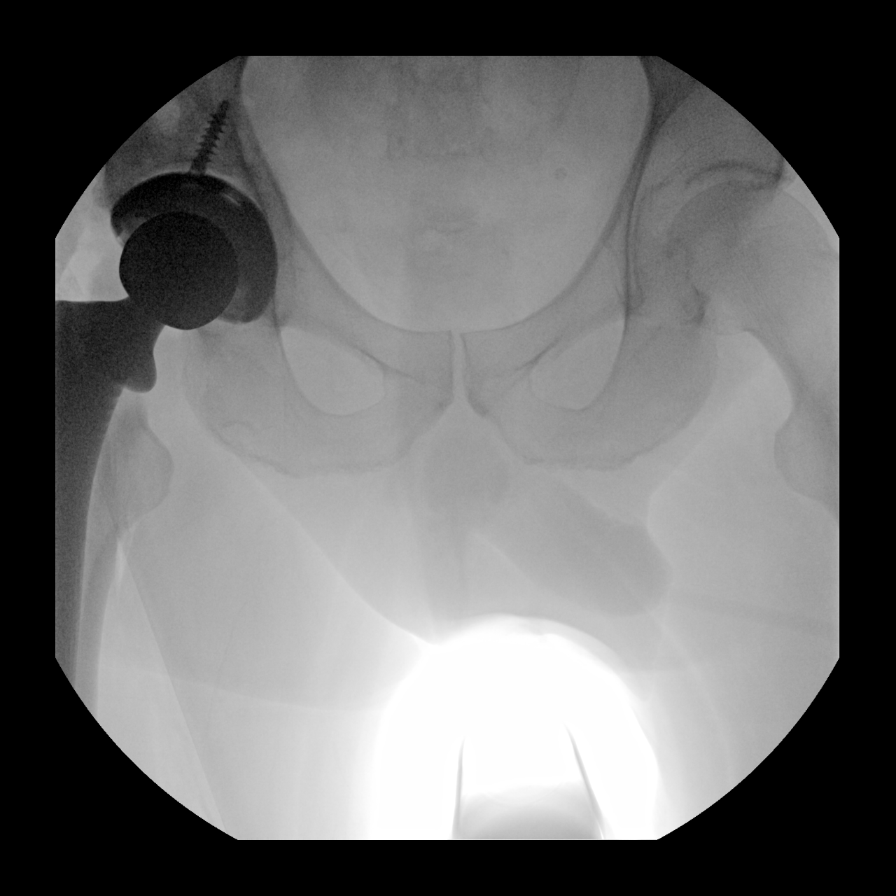

[4 of 4 positions shown; findings below may reference images not displayed]

FINDINGS: Intraoperative fluoroscopic views demonstrate interval placement of
a total right hip arthroplasty including a collared femoral stem and
a screw fixed acetabular component which appear in expected
alignment with typical postsurgical soft tissue changes including
soft tissue and intra-articular gas. Views include imaging of the
contralateral hip which demonstrate some mild osteoarthrosis similar
to comparison imaging. No acute complications are evident.
IMPRESSION: Status post total right hip arthroplasty without acute complication.

## 2021-02-24 ENCOUNTER — Ambulatory Visit: Payer: No Typology Code available for payment source | Admitting: Orthopaedic Surgery

## 2021-03-03 ENCOUNTER — Encounter: Payer: Self-pay | Admitting: Orthopaedic Surgery

## 2021-03-03 ENCOUNTER — Other Ambulatory Visit: Payer: Self-pay

## 2021-03-03 ENCOUNTER — Ambulatory Visit (INDEPENDENT_AMBULATORY_CARE_PROVIDER_SITE_OTHER): Payer: Medicaid Other | Admitting: Orthopaedic Surgery

## 2021-03-03 ENCOUNTER — Ambulatory Visit (INDEPENDENT_AMBULATORY_CARE_PROVIDER_SITE_OTHER): Payer: Medicaid Other

## 2021-03-03 DIAGNOSIS — Z96641 Presence of right artificial hip joint: Secondary | ICD-10-CM

## 2021-03-03 NOTE — Progress Notes (Signed)
HPI: Peter Wheeler returns today status post right total hip arthroplasty.  He states that hip is overall doing well.  He has some lower back discomfort like spasm at times and he points to the SI joints as the source of the spasm.  He has been referred by his primary care physician to community chiropractor is wanting to try some chiropractic care and stretching on his own.  Otherwise he denies any groin pain.  He is slight paresthesias near the incision but states incisions well-healed.  Review of systems see HPI otherwise negative  Physical exam: General well-developed well-nourished male no acute distress ambulates without any assistive device.  Transitions from sitting to standing position with ease.  Right hip: Excellent range of motion without pain.  He is able to cross his legs.  Radiographs: AP pelvis lateral view of the right hip: No acute fractures.  Bilateral hips well located.  Status post right total hip arthroplasty with well-seated arthroplasty components.  Plan: We will see him back in 6 months for an AP pelvis and lateral view of the right hip.  He is encouraged to work on scar tissue mobilization.  He will let us know if he would like to undergo physical therapy for core strengthening and back exercises.  Questions were encouraged and answered at length.

## 2021-09-06 ENCOUNTER — Ambulatory Visit: Payer: No Typology Code available for payment source | Admitting: Orthopaedic Surgery

## 2021-11-04 ENCOUNTER — Ambulatory Visit (INDEPENDENT_AMBULATORY_CARE_PROVIDER_SITE_OTHER): Payer: Medicaid Other | Admitting: Orthopaedic Surgery

## 2021-11-04 ENCOUNTER — Ambulatory Visit (INDEPENDENT_AMBULATORY_CARE_PROVIDER_SITE_OTHER): Payer: Medicaid Other

## 2021-11-04 ENCOUNTER — Encounter: Payer: Self-pay | Admitting: Orthopaedic Surgery

## 2021-11-04 DIAGNOSIS — Z96641 Presence of right artificial hip joint: Secondary | ICD-10-CM

## 2021-11-04 NOTE — Progress Notes (Signed)
Nadine Counts is now well over a year out from a right total hip arthroplasty.  He says the right hip is doing very well.  Unfortunately in January of this year he had a mechanical fall down some steps and ruptured his left quad tendon.  He was out of town at the time and this was repaired by well thought of orthopedic surgeon down in the [facility] area.  He has done well with that and is in the rehab process.  He does ambulate with a cane.  He has no issues with his right operative hip. ? ?The right hip moves smoothly and fluidly.  I did take a look at his left knee and that incision looks good.  There is swelling to be expected.  His extensor mechanism is intact. ? ?An AP pelvis and lateral right hip shows a well-seated total hip arthroplasty with no complicating features. ? ?At this point he can follow-up as needed for his right hip.  If he has any issues at all orthopedically he knows he can let us know at any time.  I am happy to see him for his knee and follow-up to if he needs it on that left side.  All questions and concerns were answered and addressed. ?

## 2023-03-10 ENCOUNTER — Other Ambulatory Visit (HOSPITAL_BASED_OUTPATIENT_CLINIC_OR_DEPARTMENT_OTHER): Payer: Self-pay | Admitting: Internal Medicine

## 2023-03-10 DIAGNOSIS — Z136 Encounter for screening for cardiovascular disorders: Secondary | ICD-10-CM

## 2023-04-27 ENCOUNTER — Ambulatory Visit (HOSPITAL_BASED_OUTPATIENT_CLINIC_OR_DEPARTMENT_OTHER)
Admission: RE | Admit: 2023-04-27 | Discharge: 2023-04-27 | Disposition: A | Discharge status: Home / Self Care | Payer: No Typology Code available for payment source | Source: Ambulatory Visit | Attending: Internal Medicine | Admitting: Internal Medicine

## 2023-04-27 DIAGNOSIS — Z136 Encounter for screening for cardiovascular disorders: Secondary | ICD-10-CM | POA: Insufficient documentation
